# Patient Record
Sex: Female | Born: 1990 | Hispanic: No | Marital: Married | Smoking: Never smoker
Health system: Southern US, Community
[De-identification: ages and names within clinical notes are randomized; demographics above are authoritative.]

## PROBLEM LIST (undated history)

## (undated) ENCOUNTER — Inpatient Hospital Stay (HOSPITAL_COMMUNITY): Payer: Medicaid Other

## (undated) ENCOUNTER — Inpatient Hospital Stay (HOSPITAL_COMMUNITY): Payer: Self-pay

## (undated) DIAGNOSIS — R51 Headache: Secondary | ICD-10-CM

## (undated) DIAGNOSIS — I10 Essential (primary) hypertension: Secondary | ICD-10-CM

## (undated) DIAGNOSIS — G8929 Other chronic pain: Secondary | ICD-10-CM

## (undated) HISTORY — DX: Essential (primary) hypertension: I10

---
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
## 2009-09-23 ENCOUNTER — Observation Stay (HOSPITAL_COMMUNITY): Admission: EM | Admit: 2009-09-23 | Discharge: 2009-09-23 | Payer: Self-pay | Admitting: Emergency Medicine

## 2010-05-06 LAB — URINE MICROSCOPIC-ADD ON: RBC / HPF: NONE SEEN RBC/hpf (ref <3)

## 2010-05-06 LAB — CBC
MCV: 81.2 fL (ref 78.0–100.0)
Platelets: 235 10*3/uL (ref 150–400)
RDW: 15 % (ref 11.5–15.5)

## 2010-05-06 LAB — URINALYSIS, ROUTINE W REFLEX MICROSCOPIC
Hgb urine dipstick: NEGATIVE
Protein, ur: NEGATIVE mg/dL
Urobilinogen, UA: 0.2 mg/dL (ref 0.0–1.0)

## 2010-05-18 LAB — URINALYSIS, ROUTINE W REFLEX MICROSCOPIC
Bilirubin Urine: NEGATIVE
Glucose, UA: NEGATIVE mg/dL
Ketones, ur: NEGATIVE mg/dL
Leukocytes, UA: NEGATIVE
Nitrite: NEGATIVE
Protein, ur: NEGATIVE mg/dL
Specific Gravity, Urine: 1.01 (ref 1.005–1.030)
Urobilinogen, UA: 1 mg/dL (ref 0.0–1.0)
pH: 7.5 (ref 5.0–8.0)

## 2010-05-18 LAB — URINE MICROSCOPIC-ADD ON

## 2010-05-18 LAB — WET PREP, GENITAL
Clue Cells Wet Prep HPF POC: NONE SEEN
Trich, Wet Prep: NONE SEEN
WBC, Wet Prep HPF POC: NONE SEEN
Yeast Wet Prep HPF POC: NONE SEEN

## 2010-05-18 LAB — POCT PREGNANCY, URINE: Preg Test, Ur: NEGATIVE

## 2010-05-18 LAB — GC/CHLAMYDIA PROBE AMP, GENITAL
Chlamydia, DNA Probe: NEGATIVE
GC Probe Amp, Genital: NEGATIVE

## 2010-09-30 LAB — GC/CHLAMYDIA PROBE AMP, GENITAL: Gonorrhea: NEGATIVE

## 2010-09-30 LAB — HEPATITIS B SURFACE ANTIGEN: Hepatitis B Surface Ag: NEGATIVE

## 2010-09-30 LAB — HIV ANTIBODY (ROUTINE TESTING W REFLEX): HIV: NONREACTIVE

## 2010-09-30 LAB — CBC: HCT: 38 % (ref 36–46)

## 2010-09-30 LAB — VARICELLA ZOSTER ANTIBODY, IGG: Varicella: NON-IMMUNE/NOT IMMUNE

## 2010-09-30 LAB — RPR: RPR: NONREACTIVE

## 2010-09-30 LAB — ABO/RH: RH Type: POSITIVE

## 2010-10-02 LAB — GLUCOSE TOLERANCE, 3 HOURS
Glucose, 1 hour: 136
Glucose, Fasting: 75 mg/dL (ref 60–109)
Glucose: 85
Glucose: 93

## 2010-11-06 ENCOUNTER — Inpatient Hospital Stay (HOSPITAL_COMMUNITY)
Admission: AD | Admit: 2010-11-06 | Discharge: 2010-11-06 | Disposition: A | Discharge status: Home / Self Care | Payer: Medicaid Other | Source: Ambulatory Visit | Attending: Obstetrics & Gynecology | Admitting: Obstetrics & Gynecology

## 2010-11-06 ENCOUNTER — Encounter (HOSPITAL_COMMUNITY): Payer: Self-pay | Admitting: *Deleted

## 2010-11-06 DIAGNOSIS — G44209 Tension-type headache, unspecified, not intractable: Secondary | ICD-10-CM | POA: Insufficient documentation

## 2010-11-06 DIAGNOSIS — O10019 Pre-existing essential hypertension complicating pregnancy, unspecified trimester: Secondary | ICD-10-CM | POA: Insufficient documentation

## 2010-11-06 DIAGNOSIS — O99891 Other specified diseases and conditions complicating pregnancy: Secondary | ICD-10-CM | POA: Insufficient documentation

## 2010-11-06 DIAGNOSIS — R1012 Left upper quadrant pain: Secondary | ICD-10-CM | POA: Insufficient documentation

## 2010-11-06 DIAGNOSIS — O0992 Supervision of high risk pregnancy, unspecified, second trimester: Secondary | ICD-10-CM

## 2010-11-06 DIAGNOSIS — S39011A Strain of muscle, fascia and tendon of abdomen, initial encounter: Secondary | ICD-10-CM

## 2010-11-06 DIAGNOSIS — O0990 Supervision of high risk pregnancy, unspecified, unspecified trimester: Secondary | ICD-10-CM

## 2010-11-06 HISTORY — DX: Headache: R51

## 2010-11-06 HISTORY — DX: Other chronic pain: G89.29

## 2010-11-06 LAB — CBC
HCT: 34.9 % — ABNORMAL LOW (ref 36.0–46.0)
Hemoglobin: 11.9 g/dL — ABNORMAL LOW (ref 12.0–15.0)
MCH: 27.4 pg (ref 26.0–34.0)
MCHC: 34.1 g/dL (ref 30.0–36.0)
WBC: 7.8 10*3/uL (ref 4.0–10.5)

## 2010-11-06 LAB — COMPREHENSIVE METABOLIC PANEL
AST: 15 U/L (ref 0–37)
Alkaline Phosphatase: 77 U/L (ref 39–117)
Chloride: 101 mEq/L (ref 96–112)
Creatinine, Ser: <0.47 mg/dL — ABNORMAL LOW (ref 0.50–1.10)
Potassium: 3.5 mEq/L (ref 3.5–5.1)
Total Protein: 7.4 g/dL (ref 6.0–8.3)

## 2010-11-06 LAB — URINALYSIS, ROUTINE W REFLEX MICROSCOPIC
Glucose, UA: NEGATIVE mg/dL
Ketones, ur: 15 mg/dL — AB
Leukocytes, UA: NEGATIVE
Nitrite: NEGATIVE
Protein, ur: NEGATIVE mg/dL
Specific Gravity, Urine: >1.03 — ABNORMAL HIGH (ref 1.005–1.030)

## 2010-11-06 LAB — URINE MICROSCOPIC-ADD ON

## 2010-11-06 MED ORDER — ACETAMINOPHEN 160 MG/5ML PO SOLN
650.0000 mg | Freq: Four times a day (QID) | ORAL | Status: DC | PRN
  Administered 2010-11-06: 649.6 mg via ORAL
  Filled 2010-11-06: qty 20.3

## 2010-11-06 NOTE — Progress Notes (Signed)
Headache for past 3 wks. Some relief with tylenol.  Sent from health dept (hx BP elevation since Aug).  Also having pain  In left upper quad, daily since beginning of preg.

## 2010-11-06 NOTE — ED Provider Notes (Signed)
Tina A Munsor19 [id number] Chief Complaint  Patient presents with  . Headache    SUBJECTIVE  HPI: Pt was sent here by [facility] where she had a prenatal visit today. She complained of a headache that has been present intermittently for 2-3 weeks and pain in her left upper quadrant resident for about 2 months. BP was elevated to about 140/90. Of note blood pressure had been mildly elevated at a prior visit and she has been referred to high-risk clinic for chronic hypertension. Also this pregnancy she had a elevated one-hour Glucola of 169 followed by a normal three-hour OGTT. She denies any history of chronic hypertension prior to the pregnancy and had no hypertension with her previous pregnancies. The headache waxes and wanes over the past 2 or 3 weeks. She describes it as numbness all of her head. 2 days ago she tried Tylenol and got some relief. She wakes up with a headache. At present she describes as mild. She denies focal neurologic symptoms or visual disturbance.  Abdominal pain is located in the left upper quadrant and she has difficulty characterizing it the adenosine baseline Interpreter. She does report that the pain is worse when she lays on her right side but denies any other relation to position, food intake, bowel movements.   Past Medical History  Diagnosis Date  . Chronic headaches    Ob Hx: Gyn Hx: History reviewed. No pertinent past surgical history. History   Social History  . Marital Status: Married    Spouse Name: N/A    Number of Children: N/A  . Years of Education: N/A   Occupational History  . Not on file.   Social History Main Topics  . Smoking status: Never Smoker   . Smokeless tobacco: Never Used  . Alcohol Use: No  . Drug Use: No  . Sexually Active: Yes   Other Topics Concern  . Not on file   Social History Narrative  . No narrative on file   No current facility-administered medications on file prior to encounter.    No current outpatient prescriptions on file prior to encounter.   No Known Allergies Review of Systems  Constitutional: Negative for fever, chills, weight loss and malaise/fatigue.  HENT: Negative for ear pain, congestion and neck pain.   Eyes: Negative for blurred vision and double vision.  Respiratory: Negative for cough.   Cardiovascular: Negative for chest pain, palpitations and orthopnea.  Gastrointestinal: Positive for heartburn and abdominal pain. Negative for nausea, vomiting, diarrhea, constipation and blood in stool.  Genitourinary: Negative for dysuria, urgency, frequency, hematuria and flank pain.  Musculoskeletal: Negative for back pain.  Neurological: Positive for headaches. Negative for weakness.  '  OBJECTIVE  BP 128/72  Pulse 93  Temp(Src) 98.6 F (37 C) (Oral)  Resp 18  Ht 5' 3.25" (1.607 m)  Wt 95.709 kg (211 lb)  BMI 37.08 kg/m2   Physical Exam: Doptone fetal heart 145  General: WN/WD in NAD Abd: obese soft nondistended. There is moderate tenderness to palpation left upper quadrant, no guarding or rebound. Pelvic: digital exam: Patient isn't tolerant of the exam but cervix is thick and closed. Back: No CVA tenderness. Neuro: grossly intact, gait nl Results for orders placed during the hospital encounter of 11/06/10 (from the past 24 hour(s))  URINALYSIS, ROUTINE W REFLEX MICROSCOPIC     Status: Abnormal   Collection Time   11/06/10  1:45 PM      Component Value Range   Color, Urine YELLOW  YELLOW    Appearance CLEAR  CLEAR    Specific Gravity, Urine >1.030 (*) 1.005 - 1.030    pH 5.5  5.0 - 8.0    Glucose, UA NEGATIVE  NEGATIVE (mg/dL)   Hgb urine dipstick TRACE (*) NEGATIVE    Bilirubin Urine NEGATIVE  NEGATIVE    Ketones, ur 15 (*) NEGATIVE (mg/dL)   Protein, ur NEGATIVE  NEGATIVE (mg/dL)   Urobilinogen, UA 0.2  0.0 - 1.0 (mg/dL)   Nitrite NEGATIVE  NEGATIVE    Leukocytes, UA NEGATIVE  NEGATIVE   URINE MICROSCOPIC-ADD ON     Status: Abnormal    Collection Time   11/06/10  1:45 PM      Component Value Range   Squamous Epithelial / LPF MANY (*) RARE    WBC, UA 0-2  <3 (WBC/hpf)   Bacteria, UA FEW (*) RARE    Crystals URIC ACID CRYSTALS (*) NEGATIVE   CBC     Status: Abnormal   Collection Time   11/06/10  1:49 PM      Component Value Range   WBC 7.8  4.0 - 10.5 (K/uL)   RBC 4.34  3.87 - 5.11 (MIL/uL)   Hemoglobin 11.9 (*) 12.0 - 15.0 (g/dL)   HCT 96.0 (*) 45.4 - 46.0 (%)   MCV 80.4  78.0 - 100.0 (fL)   MCH 27.4  26.0 - 34.0 (pg)   MCHC 34.1  30.0 - 36.0 (g/dL)   RDW 09.8  11.9 - 14.7 (%)   Platelets 211  150 - 400 (K/uL)  COMPREHENSIVE METABOLIC PANEL     Status: Abnormal (Preliminary result)   Collection Time   11/06/10  1:49 PM      Component Value Range   Sodium 135  135 - 145 (mEq/L)   Potassium 3.5  3.5 - 5.1 (mEq/L)   Chloride 101  96 - 112 (mEq/L)   CO2 23  19 - 32 (mEq/L)   Glucose, Bld 70  70 - 99 (mg/dL)   BUN 6  6 - 23 (mg/dL)   Creatinine, Ser PENDING  0.50 - 1.10 (mg/dL)   Calcium 9.4  8.4 - 82.9 (mg/dL)   Total Protein 7.4  6.0 - 8.3 (g/dL)   Albumin 3.0 (*) 3.5 - 5.2 (g/dL)   AST 15  0 - 37 (U/L)   ALT 15  0 - 35 (U/L)   Alkaline Phosphatase 77  39 - 117 (U/L)   Total Bilirubin 0.2 (*) 0.3 - 1.2 (mg/dL)   GFR calc non Af Amer NOT CALCULATED  >90 (mL/min)   GFR calc Af Amer NOT CALCULATED  >90 (mL/min)   MAU course: Headache improved after Tylenol ASSESSMENT  G2 P1 01 at 15 weeks 6 days Mild chronic hypertension Tension headache Left upper quadrant pain most likely musculoskeletal origin  PLAN: Advised not to take aspirin during pregnancy. Abdominal sites name exercises and round ligament pain handout given. Tylenol dosages for headache are reviewed. Followup at [facility] health.    PLAN

## 2010-11-06 NOTE — ED Notes (Signed)
No adverse reaction to tylenol

## 2010-11-11 DIAGNOSIS — J45909 Unspecified asthma, uncomplicated: Secondary | ICD-10-CM | POA: Insufficient documentation

## 2010-11-11 DIAGNOSIS — B019 Varicella without complication: Secondary | ICD-10-CM | POA: Insufficient documentation

## 2010-11-11 DIAGNOSIS — E669 Obesity, unspecified: Secondary | ICD-10-CM

## 2010-11-11 DIAGNOSIS — Z603 Acculturation difficulty: Secondary | ICD-10-CM | POA: Insufficient documentation

## 2010-11-11 DIAGNOSIS — I10 Essential (primary) hypertension: Secondary | ICD-10-CM | POA: Insufficient documentation

## 2010-11-11 LAB — CBC
ALT: 15 U/L (ref 7–35)
Creatine, 24H Ur: 1662
Protein, 24H Urine: 86
platelet count: 234

## 2010-11-11 NOTE — Progress Notes (Signed)
Psychosocial and domestic violence screen done at health department 09/30/10

## 2010-11-12 ENCOUNTER — Telehealth: Payer: Self-pay | Admitting: *Deleted

## 2010-11-12 ENCOUNTER — Ambulatory Visit (INDEPENDENT_AMBULATORY_CARE_PROVIDER_SITE_OTHER): Payer: Medicaid Other | Admitting: Obstetrics & Gynecology

## 2010-11-12 VITALS — BP 127/83 | Temp 98.0°F | Wt 211.3 lb

## 2010-11-12 DIAGNOSIS — Z23 Encounter for immunization: Secondary | ICD-10-CM

## 2010-11-12 DIAGNOSIS — I10 Essential (primary) hypertension: Secondary | ICD-10-CM

## 2010-11-12 LAB — POCT URINALYSIS DIP (DEVICE)
Glucose, UA: NEGATIVE mg/dL
Leukocytes, UA: NEGATIVE
Nitrite: NEGATIVE
Protein, ur: NEGATIVE mg/dL
Urobilinogen, UA: 0.2 mg/dL (ref 0.0–1.0)

## 2010-11-12 MED ORDER — INFLUENZA VIRUS VACC SPLIT PF IM SUSP
0.5000 mL | Freq: Once | INTRAMUSCULAR | Status: AC
  Administered 2010-11-12: 0.5 mL via INTRAMUSCULAR

## 2010-11-12 NOTE — Progress Notes (Signed)
Nutrition Note:  Seen for 1st [facility] visit, pt receives [facility] services. Pt dx HTN and obesity.  Pg wt-213#, current 211#.  Weight loss of 2# @ [redacted]w[redacted]d gestation plots around 3#< expected. Intake is adequate, reports 3 meals and snacks daily, no N/V or food allergies.  Disc wt gain goals of 11-20#.  Follow up if referred Cy Blamer, RD

## 2010-11-12 NOTE — Telephone Encounter (Signed)
Pt husband called with questions about Sanyiah's medicaid- would like a call back

## 2010-11-12 NOTE — Progress Notes (Signed)
Pulse 109. No vaginal discharge. Pt to see nutrition and social worker today. Pt signed consent for flu vaccine. Used interpreter [employer].

## 2010-11-12 NOTE — Progress Notes (Signed)
Sent from [facility] due to borderline HTN and headace. HA now improved, BP not elevated. No H/O preeclampsia.  Schedule U/S in 2 weeks, RTC 3 weeks. Flu vaccine today. Low risk for now Past Medical History  Diagnosis Date  . Chronic headaches   . Asthma   . Hypertension    No Known Allergies No current outpatient prescriptions on file prior to visit.   Past Surgical History  Procedure Date  . Tonsillectomy 2007

## 2010-11-17 NOTE — Telephone Encounter (Signed)
I contacted pt with [employer] interpreter # [phone].  Pt stated that her husband was not @ home but the question was about her medicaid. She states that the medicaid was only good for 2 mos and will be expired by the time of her next appt.  I explained that she may apply for financial assistance through [facility] and if she qualifies, she may get some discount on her bill. Pt did not understand and stated that she could apply for medicaid in 1 year. She states that she completed her application on [employer].  I found it difficult to explain the medicaid situation and [facility] charity care over the phone. I recommended the following to the pt :  If she and her husband still have questions about medicaid, they should to return to the [place] office for clarification. When she returns for her next clinic appt on 10/31, we will explain the charity care program to she and her husband. I scheduled pt's anatomy US so that it will be covered by her presumptive medicaid as it had already been ordered. The appt is 11/20/10 @ 1030. Pt voiced understanding.

## 2010-11-20 ENCOUNTER — Other Ambulatory Visit: Payer: Self-pay | Admitting: Obstetrics & Gynecology

## 2010-11-20 ENCOUNTER — Ambulatory Visit (HOSPITAL_COMMUNITY)
Admission: RE | Admit: 2010-11-20 | Discharge: 2010-11-20 | Disposition: A | Discharge status: Home / Self Care | Payer: Medicaid Other | Source: Ambulatory Visit | Attending: Obstetrics & Gynecology | Admitting: Obstetrics & Gynecology

## 2010-11-20 DIAGNOSIS — E669 Obesity, unspecified: Secondary | ICD-10-CM | POA: Insufficient documentation

## 2010-11-20 DIAGNOSIS — I10 Essential (primary) hypertension: Secondary | ICD-10-CM

## 2010-11-20 DIAGNOSIS — O139 Gestational [pregnancy-induced] hypertension without significant proteinuria, unspecified trimester: Secondary | ICD-10-CM | POA: Insufficient documentation

## 2010-11-20 DIAGNOSIS — Z3689 Encounter for other specified antenatal screening: Secondary | ICD-10-CM | POA: Insufficient documentation

## 2010-12-03 ENCOUNTER — Ambulatory Visit (INDEPENDENT_AMBULATORY_CARE_PROVIDER_SITE_OTHER): Payer: Medicaid Other | Admitting: Family Medicine

## 2010-12-03 ENCOUNTER — Encounter: Payer: Self-pay | Admitting: Family Medicine

## 2010-12-03 ENCOUNTER — Other Ambulatory Visit: Payer: Self-pay | Admitting: Obstetrics & Gynecology

## 2010-12-03 VITALS — BP 142/92 | Temp 98.9°F | Wt 213.9 lb

## 2010-12-03 DIAGNOSIS — O10019 Pre-existing essential hypertension complicating pregnancy, unspecified trimester: Secondary | ICD-10-CM | POA: Insufficient documentation

## 2010-12-03 LAB — POCT URINALYSIS DIP (DEVICE)
Glucose, UA: NEGATIVE mg/dL
Nitrite: NEGATIVE
Protein, ur: NEGATIVE mg/dL
Specific Gravity, Urine: 1.025 (ref 1.005–1.030)
Urobilinogen, UA: 0.2 mg/dL (ref 0.0–1.0)

## 2010-12-03 NOTE — Progress Notes (Signed)
If BP remains elevated consider agent for lowering BP---Pt. Has asthma, may consider CCB. Had h/a last pm, resolved now.

## 2010-12-03 NOTE — Progress Notes (Signed)
Pulse-108.  BP Recheck- 140/98

## 2010-12-03 NOTE — Patient Instructions (Signed)
Pregnancy - Second Trimester The second trimester of pregnancy (3 to 6 months) is a period of rapid growth for you and your baby. At the end of the sixth month, your baby is about 9 inches long and weighs 1 1/2 pounds. You will begin to feel the baby move between 18 and 20 weeks of the pregnancy. This is called quickening. Weight gain is faster. A clear fluid (colostrum) may leak out of your breasts. You may feel small contractions of the womb (uterus). This is known as false labor or Braxton-Hicks contractions. This is like a practice for labor when the baby is ready to be born. Usually, the problems with morning sickness have usually passed by the end of your first trimester. Some women develop small dark blotches (called cholasma, mask of pregnancy) on their face that usually goes away after the baby is born. Exposure to the sun makes the blotches worse. Acne may also develop in some pregnant women and pregnant women who have acne, may find that it goes away. PRENATAL EXAMS  Blood work may continue to be done during prenatal exams. These tests are done to check on your health and the probable health of your baby. Blood work is used to follow your blood levels (hemoglobin). Anemia (low hemoglobin) is common during pregnancy. Iron and vitamins are given to help prevent this. You will also be checked for diabetes between 24 and 28 weeks of the pregnancy. Some of the previous blood tests may be repeated.   The size of the uterus is measured during each visit. This is to make sure that the baby is continuing to grow properly according to the dates of the pregnancy.   Your blood pressure is checked every prenatal visit. This is to make sure you are not getting toxemia.   Your urine is checked to make sure you do not have an infection, diabetes or protein in the urine.   Your weight is checked often to make sure gains are happening at the suggested rate. This is to ensure that both you and your baby are  growing normally.   Sometimes, an ultrasound is performed to confirm the proper growth and development of the baby. This is a test which bounces harmless sound waves off the baby so your caregiver can more accurately determine due dates.  Sometimes, a specialized test is done on the amniotic fluid surrounding the baby. This test is called an amniocentesis. The amniotic fluid is obtained by sticking a needle into the belly (abdomen). This is done to check the chromosomes in instances where there is a concern about possible genetic problems with the baby. It is also sometimes done near the end of pregnancy if an early delivery is required. In this case, it is done to help make sure the baby's lungs are mature enough for the baby to live outside of the womb. CHANGES OCCURING IN THE SECOND TRIMESTER OF PREGNANCY Your body goes through many changes during pregnancy. They vary from person to person. Talk to your caregiver about changes you notice that you are concerned about.  During the second trimester, you will likely have an increase in your appetite. It is normal to have cravings for certain foods. This varies from person to person and pregnancy to pregnancy.   Your lower abdomen will begin to bulge.   You may have to urinate more often because the uterus and baby are pressing on your bladder. It is also common to get more bladder infections during pregnancy (  pain with urination). You can help this by drinking lots of fluids and emptying your bladder before and after intercourse.   You may begin to get stretch marks on your hips, abdomen, and breasts. These are normal changes in the body during pregnancy. There are no exercises or medications to take that prevent this change.   You may begin to develop swollen and bulging veins (varicose veins) in your legs. Wearing support hose, elevating your feet for 15 minutes, 3 to 4 times a day and limiting salt in your diet helps lessen the problem.    Heartburn may develop as the uterus grows and pushes up against the stomach. Antacids recommended by your caregiver helps with this problem. Also, eating smaller meals 4 to 5 times a day helps.   Constipation can be treated with a stool softener or adding bulk to your diet. Drinking lots of fluids, vegetables, fruits, and whole grains are helpful.   Exercising is also helpful. If you have been very active up until your pregnancy, most of these activities can be continued during your pregnancy. If you have been less active, it is helpful to start an exercise program such as walking.   Hemorrhoids (varicose veins in the rectum) may develop at the end of the second trimester. Warm sitz baths and hemorrhoid cream recommended by your caregiver helps hemorrhoid problems.   Backaches may develop during this time of your pregnancy. Avoid heavy lifting, wear low heal shoes and practice good posture to help with backache problems.   Some pregnant women develop tingling and numbness of their hand and fingers because of swelling and tightening of ligaments in the wrist (carpel tunnel syndrome). This goes away after the baby is born.   As your breasts enlarge, you may have to get a bigger bra. Get a comfortable, cotton, support bra. Do not get a nursing bra until the last month of the pregnancy if you will be nursing the baby.   You may get a dark line from your belly button to the pubic area called the linea nigra.   You may develop rosy cheeks because of increase blood flow to the face.   You may develop spider looking lines of the face, neck, arms and chest. These go away after the baby is born.  HOME CARE INSTRUCTIONS   It is extremely important to avoid all smoking, herbs, alcohol, and unprescribed drugs during your pregnancy. These chemicals affect the formation and growth of the baby. Avoid these chemicals throughout the pregnancy to ensure the delivery of a healthy infant.   Most of your home  care instructions are the same as suggested for the first trimester of your pregnancy. Keep your caregiver's appointments. Follow your caregiver's instructions regarding medication use, exercise and diet.   During pregnancy, you are providing food for you and your baby. Continue to eat regular, well-balanced meals. Choose foods such as meat, fish, milk and other low fat dairy products, vegetables, fruits, and whole-grain breads and cereals. Your caregiver will tell you of the ideal weight gain.   A physical sexual relationship may be continued up until near the end of pregnancy if there are no other problems. Problems could include early (premature) leaking of amniotic fluid from the membranes, vaginal bleeding, abdominal pain, or other medical or pregnancy problems.   Exercise regularly if there are no restrictions. Check with your caregiver if you are unsure of the safety of some of your exercises. The greatest weight gain will occur in the   last 2 trimesters of pregnancy. Exercise will help you:   Control your weight.   Get you in shape for labor and delivery.   Lose weight after you have the baby.   Wear a good support or jogging bra for breast tenderness during pregnancy. This may help if worn during sleep. Pads or tissues may be used in the bra if you are leaking colostrum.   Do not use hot tubs, steam rooms or saunas throughout the pregnancy.   Wear your seat belt at all times when driving. This protects you and your baby if you are in an accident.   Avoid raw meat, uncooked cheese, cat litter boxes and soil used by cats. These carry germs that can cause birth defects in the baby.   The second trimester is also a good time to visit your dentist for your dental health if this has not been done yet. Getting your teeth cleaned is OK. Use a soft toothbrush. Brush gently during pregnancy.   It is easier to loose urine during pregnancy. Tightening up and strengthening the pelvic muscles will  help with this problem. Practice stopping your urination while you are going to the bathroom. These are the same muscles you need to strengthen. It is also the muscles you would use as if you were trying to stop from passing gas. You can practice tightening these muscles up 10 times a set and repeating this about 3 times per day. Once you know what muscles to tighten up, do not perform these exercises during urination. It is more likely to contribute to an infection by backing up the urine.   Ask for help if you have financial, counseling or nutritional needs during pregnancy. Your caregiver will be able to offer counseling for these needs as well as refer you for other special needs.   Your skin may become oily. If so, wash your face with mild soap, use non-greasy moisturizer and oil or cream based makeup.  MEDICATIONS AND DRUG USE IN PREGNANCY  Take prenatal vitamins as directed. The vitamin should contain 1 milligram of folic acid. Keep all vitamins out of reach of children. Only a couple vitamins or tablets containing iron may be fatal to a baby or young child when ingested.   Avoid use of all medications, including herbs, over-the-counter medications, not prescribed or suggested by your caregiver. Only take over-the-counter or prescription medicines for pain, discomfort, or fever as directed by your caregiver. Do not use aspirin.   Let your caregiver also know about herbs you may be using.   Alcohol is related to a number of birth defects. This includes fetal alcohol syndrome. All alcohol, in any form, should be avoided completely. Smoking will cause low birth rate and premature babies.   Street or illegal drugs are very harmful to the baby. They are absolutely forbidden. A baby born to an addicted mother will be addicted at birth. The baby will go through the same withdrawal an adult does.  SEEK MEDICAL CARE IF:  You have any concerns or worries during your pregnancy. It is better to call with  your questions if you feel they cannot wait, rather than worry about them. SEEK IMMEDIATE MEDICAL CARE IF:   An unexplained oral temperature above 102 F (38.9 C) develops, or as your caregiver suggests.   You have leaking of fluid from the vagina (birth canal). If leaking membranes are suspected, take your temperature and tell your caregiver of this when you call.   There   is vaginal spotting, bleeding, or passing clots. Tell your caregiver of the amount and how many pads are used. Light spotting in pregnancy is common, especially following intercourse.   You develop a bad smelling vaginal discharge with a change in the color from clear to white.   You continue to feel sick to your stomach (nauseated) and have no relief from remedies suggested. You vomit blood or coffee ground-like materials.   You lose more than 2 pounds of weight or gain more than 2 pounds of weight over 1 week, or as suggested by your caregiver.   You notice swelling of your face, hands, feet, or legs.   You get exposed to German measles and have never had them.   You are exposed to fifth disease or chickenpox.   You develop belly (abdominal) pain. Round ligament discomfort is a common non-cancerous (benign) cause of abdominal pain in pregnancy. Your caregiver still must evaluate you.   You develop a bad headache that does not go away.   You develop fever, diarrhea, pain with urination, or shortness of breath.   You develop visual problems, blurry, or double vision.   You fall or are in a car accident or any kind of trauma.   There is mental or physical violence at home.  Document Released: 01/12/2001 Document Revised: 09/30/2010 Document Reviewed: 07/17/2008 [employer] Patient Information 2012 [employer]. Breastfeeding BENEFITS OF BREASTFEEDING For the baby  The first milk (colostrum) helps the baby's digestive system function better.   There are antibodies from the mother in the milk that help the  baby fight off infections.   The baby has a lower incidence of asthma, allergies, and SIDS (sudden infant death syndrome).   The nutrients in breast milk are better than formulas for the baby and helps the baby's brain grow better.   Babies who breastfeed have less gas, colic, and constipation.  For the mother  Breastfeeding helps develop a very special bond between mother and baby.   It is more convenient, always available at the correct temperature and cheaper than formula feeding.   It burns calories in the mother and helps with losing weight that was gained during pregnancy.   It makes the uterus contract back down to normal size faster and slows bleeding following delivery.   Breastfeeding mothers have a lower risk of developing breast cancer.  NURSE FREQUENTLY  A healthy, full-term baby may breastfeed as often as every hour or space his or her feedings to every 3 hours.   How often to nurse will vary from baby to baby. Watch your baby for signs of hunger, not the clock.   Nurse as often as the baby requests, or when you feel the need to reduce the fullness of your breasts.   Awaken the baby if it has been 3 to 4 hours since the last feeding.   Frequent feeding will help the mother make more milk and will prevent problems like sore nipples and engorgement of the breasts.  BABY'S POSITION AT THE BREAST  Whether lying down or sitting, be sure that the baby's tummy is facing your tummy.   Support the breast with 4 fingers underneath the breast and the thumb above. Make sure your fingers are well away from the nipple and baby's mouth.   Stroke the baby's lips and cheek closest to the breast gently with your finger or nipple.   When the baby's mouth is open wide enough, place all of your nipple and as much   of the dark area around the nipple as possible into your baby's mouth.   Pull the baby in close so the tip of the nose and the baby's cheeks touch the breast during the  feeding.  FEEDINGS  The length of each feeding varies from baby to baby and from feeding to feeding.   The baby must suck about 2 to 3 minutes for your milk to get to him or her. This is called a "let down." For this reason, allow the baby to feed on each breast as long as he or she wants. Your baby will end the feeding when he or she has received the right balance of nutrients.   To break the suction, put your finger into the corner of the baby's mouth and slide it between his or her gums before removing your breast from his or her mouth. This will help prevent sore nipples.  REDUCING BREAST ENGORGEMENT  In the first week after your baby is born, you may experience signs of breast engorgement. When breasts are engorged, they feel heavy, warm, full, and may be tender to the touch. You can reduce engorgement if you:   Nurse frequently, every 2 to 3 hours. Mothers who breastfeed early and often have fewer problems with engorgement.   Place light ice packs on your breasts between feedings. This reduces swelling. Wrap the ice packs in a lightweight towel to protect your skin.   Apply moist hot packs to your breast for 5 to 10 minutes before each feeding. This increases circulation and helps the milk flow.   Gently massage your breast before and during the feeding.   Make sure that the baby empties at least one breast at every feeding before switching sides.   Use a breast pump to empty the breasts if your baby is sleepy or not nursing well. You may also want to pump if you are returning to work or or you feel you are getting engorged.   Avoid bottle feeds, pacifiers or supplemental feedings of water or juice in place of breastfeeding.   Be sure the baby is latched on and positioned properly while breastfeeding.   Prevent fatigue, stress, and anemia.   Wear a supportive bra, avoiding underwire styles.   Eat a balanced diet with enough fluids.  If you follow these suggestions, your  engorgement should improve in 24 to 48 hours. If you are still experiencing difficulty, call your lactation consultant or caregiver. IS MY BABY GETTING ENOUGH MILK? Sometimes, mothers worry about whether their babies are getting enough milk. You can be assured that your baby is getting enough milk if:  The baby is actively sucking and you hear swallowing.   The baby nurses at least 8 to 12 times in a 24 hour time period. Nurse your baby until he or she unlatches or falls asleep at the first breast (at least 10 to 20 minutes), then offer the second side.   The baby is wetting 5 to 6 disposable diapers (6 to 8 cloth diapers) in a 24 hour period by 5 to 6 days of age.   The baby is having at least 2 to 3 stools every 24 hours for the first few months. Breast milk is all the food your baby needs. It is not necessary for your baby to have water or formula. In fact, to help your breasts make more milk, it is best not to give your baby supplemental feedings during the early weeks.   The stool should   be soft and yellow.   The baby should gain 4 to 7 ounces per week after he is 4 days old.  TAKE CARE OF YOURSELF Take care of your breasts by:  Bathing or showering daily.   Avoiding the use of soaps on your nipples.   Start feedings on your left breast at one feeding and on your right breast at the next feeding.   You will notice an increase in your milk supply 2 to 5 days after delivery. You may feel some discomfort from engorgement, which makes your breasts very firm and often tender. Engorgement "peaks" out within 24 to 48 hours. In the meantime, apply warm moist towels to your breasts for 5 to 10 minutes before feeding. Gentle massage and expression of some milk before feeding will soften your breasts, making it easier for your baby to latch on. Wear a well fitting nursing bra and air dry your nipples for 10 to 15 minutes after each feeding.   Only use cotton bra pads.   Only use pure lanolin on  your nipples after nursing. You do not need to wash it off before nursing.  Take care of yourself by:   Eating well-balanced meals and nutritious snacks.   Drinking milk, fruit juice, and water to satisfy your thirst (about 8 glasses a day).   Getting plenty of rest.   Increasing calcium in your diet (1200 mg a day).   Avoiding foods that you notice affect the baby in a bad way.  SEEK MEDICAL CARE IF:   You have any questions or difficulty with breastfeeding.   You need help.   You have a hard, red, sore area on your breast, accompanied by a fever of 100.5 F (38.1 C) or more.   Your baby is too sleepy to eat well or is having trouble sleeping.   Your baby is wetting less than 6 diapers per day, by 5 days of age.   Your baby's skin or white part of his or her eyes is more yellow than it was in the hospital.   You feel depressed.  Document Released: 01/18/2005 Document Revised: 09/30/2010 Document Reviewed: 09/02/2008 [employer] Patient Information 2012 [employer]. 

## 2010-12-07 ENCOUNTER — Inpatient Hospital Stay (HOSPITAL_COMMUNITY)
Admission: AD | Admit: 2010-12-07 | Discharge: 2010-12-07 | Disposition: A | Discharge status: Home / Self Care | Payer: Medicaid Other | Source: Ambulatory Visit | Attending: Obstetrics and Gynecology | Admitting: Obstetrics and Gynecology

## 2010-12-07 DIAGNOSIS — G44209 Tension-type headache, unspecified, not intractable: Secondary | ICD-10-CM

## 2010-12-07 DIAGNOSIS — O99891 Other specified diseases and conditions complicating pregnancy: Secondary | ICD-10-CM | POA: Insufficient documentation

## 2010-12-07 DIAGNOSIS — O10019 Pre-existing essential hypertension complicating pregnancy, unspecified trimester: Secondary | ICD-10-CM

## 2010-12-07 LAB — URINALYSIS, ROUTINE W REFLEX MICROSCOPIC
Glucose, UA: NEGATIVE mg/dL
Ketones, ur: 15 mg/dL — AB
Protein, ur: NEGATIVE mg/dL
Urobilinogen, UA: 0.2 mg/dL (ref 0.0–1.0)

## 2010-12-07 MED ORDER — CYCLOBENZAPRINE HCL 10 MG PO TABS: 10.0000 mg | ORAL_TABLET | Freq: Two times a day (BID) | ORAL | Status: AC | PRN

## 2010-12-07 MED ORDER — ACETAMINOPHEN 325 MG PO TABS
650.0000 mg | ORAL_TABLET | Freq: Once | ORAL | Status: AC
  Administered 2010-12-07: 650 mg via ORAL
  Filled 2010-12-07: qty 2

## 2010-12-07 MED ORDER — CYCLOBENZAPRINE HCL 10 MG PO TABS
10.0000 mg | ORAL_TABLET | Freq: Once | ORAL | Status: AC
  Administered 2010-12-07: 10 mg via ORAL
  Filled 2010-12-07: qty 1

## 2010-12-07 NOTE — Progress Notes (Signed)
On going headache.  " feeling like a heart beat in abd".  [facility] them last [facility].   Was to follow up with clinic in 2 wks.  No problem with  Last preg.

## 2010-12-07 NOTE — Progress Notes (Signed)
Has headache0 ongoing problem, wants blood pressure checked

## 2010-12-07 NOTE — ED Provider Notes (Signed)
History     Chief Complaint  Patient presents with  . Headache   Headache  Pertinent negatives include no abdominal pain, blurred vision, dizziness, hearing loss, nausea, photophobia or vomiting.   This is a 20 year old G2 P1001 at 20 weeks and 1 day who presents with a headache for 2 weeks that has worsened over the past 3 days.  The headache is intermittent and is non-radiating.  She describes it as sharp at times, as well as pressure.  She denies vision changes, abdominal pain, nausea, fever, chills, myalgias.  She has not been treated for her hypertension yet, which she was diagnosed at her last doctor's appointment.  She denies photo and phonophobia.  OB History    Grav Para Term Preterm Abortions TAB SAB Ect Mult Living   2 1 1  0 0 0 0 0 0 1      Past Medical History  Diagnosis Date  . Chronic headaches   . Asthma   . Hypertension     Past Surgical History  Procedure Date  . Tonsillectomy 2007    Family History  Problem Relation Age of Onset  . Heart disease Mother   . Hypertension Mother   . Diabetes Mother   . Heart disease Father   . Diabetes Brother     History  Substance Use Topics  . Smoking status: Never Smoker   . Smokeless tobacco: Never Used  . Alcohol Use: No    Allergies: No Known Allergies  Prescriptions prior to admission  Medication Sig Dispense Refill  . acetaminophen (TYLENOL) 500 MG tablet Take 500 mg by mouth every 6 (six) hours as needed.        Marland Kitchen albuterol (PROVENTIL HFA;VENTOLIN HFA) 108 (90 BASE) MCG/ACT inhaler Inhale 2 puffs into the lungs every 6 (six) hours as needed.        . Prenatal Vit-Fe Psac Cmplx-FA (PRENATAL MULTIVITAMIN) 60-1 MG tablet Take 1 tablet by mouth daily.          Review of Systems  HENT: Negative for hearing loss.   Eyes: Negative for blurred vision, double vision and photophobia.  Gastrointestinal: Negative for nausea, vomiting and abdominal pain.  Genitourinary: Negative for dysuria, urgency, frequency  and hematuria.  Musculoskeletal: Negative for myalgias.  Neurological: Positive for headaches. Negative for dizziness, tremors and focal weakness.   Physical Exam   Blood pressure 128/73, pulse 103, temperature 99.1 F (37.3 C), temperature source Oral, resp. rate 20, height 5' 4.5" (1.638 m), weight 97.977 kg (216 lb), last menstrual period 07/19/2010, SpO2 98.00%.  Physical Exam  Constitutional: She is oriented to person, place, and time. She appears well-developed and well-nourished.  HENT:  Head: Normocephalic and atraumatic.       Increased cervical paraspinal tenderness  Eyes: Pupils are equal, round, and reactive to light.  Neck: Normal range of motion.  Cardiovascular: Normal rate, regular rhythm and normal heart sounds.   Respiratory: Effort normal and breath sounds normal. No respiratory distress. She has no wheezes. She has no rales.  GI: She exhibits no distension and no mass. There is no tenderness. There is no rebound and no guarding.  Neurological: She is alert and oriented to person, place, and time. She has normal reflexes. No cranial nerve deficit.  Skin: Skin is warm and dry.   UA - negative leukocytes with specific gravity of > 1.030.  Neg protein  MAU Course  Procedures  MDM As the patient is currently normotensive here and without proteinuria,  unlikely that this represents preeclampsia.  This is likely a tension headache, based on musculoskeletal tenderness in the cervical spine, combined with dehydration.  Assessment and Plan  1.  19 yo G2P1001 at 20 weeks and 1 day. 2.  Tension headache.  Will prescribe flexeril.  Described to patient that this can be sedating and that she should not drive.  Also discussed that she should drink 2-3 L of water to avoid dehydration.  Patient to follow up at [facility] clinics.    Barret Esquivel JEHIEL 12/07/2010, 7:17 PM

## 2010-12-17 ENCOUNTER — Ambulatory Visit (INDEPENDENT_AMBULATORY_CARE_PROVIDER_SITE_OTHER): Payer: Medicaid Other | Admitting: Obstetrics & Gynecology

## 2010-12-17 DIAGNOSIS — O0990 Supervision of high risk pregnancy, unspecified, unspecified trimester: Secondary | ICD-10-CM

## 2010-12-17 DIAGNOSIS — O10019 Pre-existing essential hypertension complicating pregnancy, unspecified trimester: Secondary | ICD-10-CM

## 2010-12-17 DIAGNOSIS — Z23 Encounter for immunization: Secondary | ICD-10-CM

## 2010-12-17 DIAGNOSIS — O169 Unspecified maternal hypertension, unspecified trimester: Secondary | ICD-10-CM

## 2010-12-17 LAB — POCT URINALYSIS DIP (DEVICE)
Bilirubin Urine: NEGATIVE
Glucose, UA: NEGATIVE mg/dL
Hgb urine dipstick: NEGATIVE
Nitrite: NEGATIVE

## 2010-12-17 MED ORDER — TETANUS-DIPHTH-ACELL PERTUSSIS 5-2.5-18.5 LF-MCG/0.5 IM SUSP
0.5000 mL | Freq: Once | INTRAMUSCULAR | Status: AC
  Administered 2010-12-17: 0.5 mL via INTRAMUSCULAR

## 2010-12-17 NOTE — Patient Instructions (Signed)
Breastfeeding BENEFITS OF BREASTFEEDING For the baby  The first milk (colostrum) helps the baby's digestive system function better.   There are antibodies from the mother in the milk that help the baby fight off infections.   The baby has a lower incidence of asthma, allergies, and SIDS (sudden infant death syndrome).   The nutrients in breast milk are better than formulas for the baby and helps the baby's brain grow better.   Babies who breastfeed have less gas, colic, and constipation.  For the mother  Breastfeeding helps develop a very special bond between mother and baby.   It is more convenient, always available at the correct temperature and cheaper than formula feeding.   It burns calories in the mother and helps with losing weight that was gained during pregnancy.   It makes the uterus contract back down to normal size faster and slows bleeding following delivery.   Breastfeeding mothers have a lower risk of developing breast cancer.  NURSE FREQUENTLY  A healthy, full-term baby may breastfeed as often as every hour or space his or her feedings to every 3 hours.   How often to nurse will vary from baby to baby. Watch your baby for signs of hunger, not the clock.   Nurse as often as the baby requests, or when you feel the need to reduce the fullness of your breasts.   Awaken the baby if it has been 3 to 4 hours since the last feeding.   Frequent feeding will help the mother make more milk and will prevent problems like sore nipples and engorgement of the breasts.  BABY'S POSITION AT THE BREAST  Whether lying down or sitting, be sure that the baby's tummy is facing your tummy.   Support the breast with 4 fingers underneath the breast and the thumb above. Make sure your fingers are well away from the nipple and baby's mouth.   Stroke the baby's lips and cheek closest to the breast gently with your finger or nipple.   When the baby's mouth is open wide enough, place  all of your nipple and as much of the dark area around the nipple as possible into your baby's mouth.   Pull the baby in close so the tip of the nose and the baby's cheeks touch the breast during the feeding.  FEEDINGS  The length of each feeding varies from baby to baby and from feeding to feeding.   The baby must suck about 2 to 3 minutes for your milk to get to him or her. This is called a "let down." For this reason, allow the baby to feed on each breast as long as he or she wants. Your baby will end the feeding when he or she has received the right balance of nutrients.   To break the suction, put your finger into the corner of the baby's mouth and slide it between his or her gums before removing your breast from his or her mouth. This will help prevent sore nipples.  REDUCING BREAST ENGORGEMENT  In the first week after your baby is born, you may experience signs of breast engorgement. When breasts are engorged, they feel heavy, warm, full, and may be tender to the touch. You can reduce engorgement if you:   Nurse frequently, every 2 to 3 hours. Mothers who breastfeed early and often have fewer problems with engorgement.   Place light ice packs on your breasts between feedings. This reduces swelling. Wrap the ice packs in a   lightweight towel to protect your skin.   Apply moist hot packs to your breast for 5 to 10 minutes before each feeding. This increases circulation and helps the milk flow.   Gently massage your breast before and during the feeding.   Make sure that the baby empties at least one breast at every feeding before switching sides.   Use a breast pump to empty the breasts if your baby is sleepy or not nursing well. You may also want to pump if you are returning to work or or you feel you are getting engorged.   Avoid bottle feeds, pacifiers or supplemental feedings of water or juice in place of breastfeeding.   Be sure the baby is latched on and positioned properly while  breastfeeding.   Prevent fatigue, stress, and anemia.   Wear a supportive bra, avoiding underwire styles.   Eat a balanced diet with enough fluids.  If you follow these suggestions, your engorgement should improve in 24 to 48 hours. If you are still experiencing difficulty, call your lactation consultant or caregiver. IS MY BABY GETTING ENOUGH MILK? Sometimes, mothers worry about whether their babies are getting enough milk. You can be assured that your baby is getting enough milk if:  The baby is actively sucking and you hear swallowing.   The baby nurses at least 8 to 12 times in a 24 hour time period. Nurse your baby until he or she unlatches or falls asleep at the first breast (at least 10 to 20 minutes), then offer the second side.   The baby is wetting 5 to 6 disposable diapers (6 to 8 cloth diapers) in a 24 hour period by 5 to 6 days of age.   The baby is having at least 2 to 3 stools every 24 hours for the first few months. Breast milk is all the food your baby needs. It is not necessary for your baby to have water or formula. In fact, to help your breasts make more milk, it is best not to give your baby supplemental feedings during the early weeks.   The stool should be soft and yellow.   The baby should gain 4 to 7 ounces per week after he is 4 days old.  TAKE CARE OF YOURSELF Take care of your breasts by:  Bathing or showering daily.   Avoiding the use of soaps on your nipples.   Start feedings on your left breast at one feeding and on your right breast at the next feeding.   You will notice an increase in your milk supply 2 to 5 days after delivery. You may feel some discomfort from engorgement, which makes your breasts very firm and often tender. Engorgement "peaks" out within 24 to 48 hours. In the meantime, apply warm moist towels to your breasts for 5 to 10 minutes before feeding. Gentle massage and expression of some milk before feeding will soften your breasts, making  it easier for your baby to latch on. Wear a well fitting nursing bra and air dry your nipples for 10 to 15 minutes after each feeding.   Only use cotton bra pads.   Only use pure lanolin on your nipples after nursing. You do not need to wash it off before nursing.  Take care of yourself by:   Eating well-balanced meals and nutritious snacks.   Drinking milk, fruit juice, and water to satisfy your thirst (about 8 glasses a day).   Getting plenty of rest.   Increasing calcium in   your diet (1200 mg a day).   Avoiding foods that you notice affect the baby in a bad way.  SEEK MEDICAL CARE IF:   You have any questions or difficulty with breastfeeding.   You need help.   You have a hard, red, sore area on your breast, accompanied by a fever of 100.5 F (38.1 C) or more.   Your baby is too sleepy to eat well or is having trouble sleeping.   Your baby is wetting less than 6 diapers per day, by 5 days of age.   Your baby's skin or white part of his or her eyes is more yellow than it was in the hospital.   You feel depressed.  Document Released: 01/18/2005 Document Revised: 09/30/2010 Document Reviewed: 09/02/2008 [employer] Patient Information 2012 [employer]. 

## 2010-12-17 NOTE — Progress Notes (Signed)
Used Interpreter: Bedor Elnegomi Good FM, no problems, no HA

## 2010-12-17 NOTE — Progress Notes (Signed)
Addended by: Faythe Casa on: 12/17/2010 02:06 PM   Modules accepted: Orders

## 2010-12-31 ENCOUNTER — Ambulatory Visit (INDEPENDENT_AMBULATORY_CARE_PROVIDER_SITE_OTHER): Payer: Medicaid Other | Admitting: Physician Assistant

## 2010-12-31 VITALS — BP 132/94 | Temp 98.0°F | Wt 213.6 lb

## 2010-12-31 DIAGNOSIS — O169 Unspecified maternal hypertension, unspecified trimester: Secondary | ICD-10-CM

## 2010-12-31 DIAGNOSIS — O0990 Supervision of high risk pregnancy, unspecified, unspecified trimester: Secondary | ICD-10-CM

## 2010-12-31 DIAGNOSIS — O10019 Pre-existing essential hypertension complicating pregnancy, unspecified trimester: Secondary | ICD-10-CM

## 2010-12-31 LAB — CBC
HCT: 36.1 % (ref 36.0–46.0)
Hemoglobin: 12.5 g/dL (ref 12.0–15.0)
RBC: 4.53 MIL/uL (ref 3.87–5.11)
WBC: 6.4 10*3/uL (ref 4.0–10.5)

## 2010-12-31 LAB — COMPREHENSIVE METABOLIC PANEL
Albumin: 3.5 g/dL (ref 3.5–5.2)
BUN: 5 mg/dL — ABNORMAL LOW (ref 6–23)
CO2: 20 mEq/L (ref 19–32)
Calcium: 9.2 mg/dL (ref 8.4–10.5)
Chloride: 103 mEq/L (ref 96–112)
Glucose, Bld: 68 mg/dL — ABNORMAL LOW (ref 70–99)
Potassium: 4.1 mEq/L (ref 3.5–5.3)

## 2010-12-31 LAB — POCT URINALYSIS DIP (DEVICE)
Bilirubin Urine: NEGATIVE
Leukocytes, UA: NEGATIVE
pH: 7 (ref 5.0–8.0)

## 2010-12-31 MED ORDER — NATALCARE PIC 60-1 MG PO TABS: 1.0000 | ORAL_TABLET | Freq: Every day | ORAL | Status: DC

## 2010-12-31 NOTE — Progress Notes (Signed)
Reports HA with visual disturbance 3 days ago. Denies today. + FM daily. Repeat labs and pro/creat ratio today. Growth [place] scheduled.

## 2010-12-31 NOTE — Patient Instructions (Addendum)
Preeclampsia and Eclampsia Preeclampsia is a condition of high blood pressure during pregnancy. It can happen at 20 weeks or later in pregnancy. If high blood pressure occurs in the second half of pregnancy with no other symptoms, it is called gestational hypertension and goes away after the baby is born. If any of the symptoms listed below develop with gestational hypertension, it is then called preeclampsia. Eclampsia (convulsions) may follow preeclampsia. This is one of the reasons for regular prenatal checkups. Early diagnosis and treatment are very important to prevent eclampsia. CAUSES  There is no known cause of preeclampsia/eclampsia in pregnancy. There are several known conditions that may put the pregnant woman at risk, such as:  The first pregnancy.   Having preeclampsia in a past pregnancy.   Having lasting (chronic) high blood pressure.   Having multiples (twins, triplets).   Being age 47 or older.   African American ethnic background.   Having kidney disease or diabetes.   Medical conditions such as lupus or blood diseases.   Being overweight (obese).  SYMPTOMS   High blood pressure.   Headaches.   Sudden weight gain.   Swelling of hands, face, legs, and feet.   Protein in the urine.   Feeling sick to your stomach (nauseous) and throwing up (vomiting).   Vision problems (blurred or double vision).   Numbness in the face, arms, legs, and feet.   Dizziness.   Slurred speech.   Preeclampsia can cause growth retardation in the fetus.   Separation (abruption) of the placenta.   Not enough fluid in the amniotic sac (oligohydramnios).   Sensitivity to bright lights.   Belly (abdominal) pain.  DIAGNOSIS  If protein is found in the urine in the second half of pregnancy, this is considered preeclampsia. Other symptoms mentioned above may also be present. TREATMENT  It is necessary to treat this.  Your caregiver may prescribe bed rest early in this  condition. Plenty of rest and salt restriction may be all that is needed.   Medicines may be necessary to lower blood pressure if the condition does not respond to more conservative measures.   In more severe cases, hospitalization may be needed:   For treatment of blood pressure.   To control fluid retention.   To monitor the baby to see if the condition is causing harm to the baby.   Hospitalization is the best way to treat the first sign of preeclampsia. This is so the mother and baby can be watched closely and blood tests can be done effectively and correctly.   If the condition becomes severe, it may be necessary to induce labor or to remove the infant by surgical means (cesarean section). The best cure for preeclampsia/eclampsia is to deliver the baby.  Preeclampsia and eclampsia involve risks to mother and infant. Your caregiver will discuss these risks with you. Together, you can work out the best possible approach to your problems. Make sure you keep your prenatal visits as scheduled. Not keeping appointments could result in a chronic or permanent injury, pain, disability to you, and death or injury to you or your unborn baby. If there is any problem keeping the appointment, you must call to reschedule. HOME CARE INSTRUCTIONS   Keep your prenatal appointments and tests as scheduled.   Tell your caregiver if you have any of the above risk factors.   Get plenty of rest and sleep.   Eat a balanced diet that is low in salt, and do not add salt  to your food.   Avoid stressful situations.   Only take over-the-counter and prescriptions medicines for pain, discomfort, or fever as directed by your caregiver.  SEEK IMMEDIATE MEDICAL CARE IF:   You develop severe swelling anywhere in the body. This usually occurs in the legs.   You gain 5 lb/2.3 kg or more in a week.   You develop a severe headache, dizziness, problems with your vision, or confusion.   You have abdominal pain,  nausea, or vomiting.   You have a seizure.   You have trouble moving any part of your body, or you develop numbness or problems speaking.   You have bruising or abnormal bleeding from anywhere in the body.   You develop a stiff neck.   You pass out.  MAKE SURE YOU:   Understand these instructions.   Will watch your condition.   Will get help right away if you are not doing well or get worse.  Document Released: 01/16/2000 Document Revised: 09/30/2010 Document Reviewed: 09/01/2007 [facility] Patient Information 2012 [place], [place]. Pregnancy - Second Trimester The second trimester of pregnancy (3 to 6 months) is a period of rapid growth for you and your baby. At the end of the sixth month, your baby is about 9 inches long and weighs 1 1/2 pounds. You will begin to feel the baby move between 18 and 20 weeks of the pregnancy. This is called quickening. Weight gain is faster. A clear fluid (colostrum) may leak out of your breasts. You may feel small contractions of the womb (uterus). This is known as false labor or Braxton-Hicks contractions. This is like a practice for labor when the baby is ready to be born. Usually, the problems with morning sickness have usually passed by the end of your first trimester. Some women develop small dark blotches (called cholasma, mask of pregnancy) on their face that usually goes away after the baby is born. Exposure to the sun makes the blotches worse. Acne may also develop in some pregnant women and pregnant women who have acne, may find that it goes away. PRENATAL EXAMS  Blood work may continue to be done during prenatal exams. These tests are done to check on your health and the probable health of your baby. Blood work is used to follow your blood levels (hemoglobin). Anemia (low hemoglobin) is common during pregnancy. Iron and vitamins are given to help prevent this. You will also be checked for diabetes between 24 and 28 weeks of the pregnancy. Some of the  previous blood tests may be repeated.   The size of the uterus is measured during each visit. This is to make sure that the baby is continuing to grow properly according to the dates of the pregnancy.   Your blood pressure is checked every prenatal visit. This is to make sure you are not getting toxemia.   Your urine is checked to make sure you do not have an infection, diabetes or protein in the urine.   Your weight is checked often to make sure gains are happening at the suggested rate. This is to ensure that both you and your baby are growing normally.   Sometimes, an ultrasound is performed to confirm the proper growth and development of the baby. This is a test which bounces harmless sound waves off the baby so your caregiver can more accurately determine due dates.  Sometimes, a specialized test is done on the amniotic fluid surrounding the baby. This test is called an amniocentesis. The amniotic  fluid is obtained by sticking a needle into the belly (abdomen). This is done to check the chromosomes in instances where there is a concern about possible genetic problems with the baby. It is also sometimes done near the end of pregnancy if an early delivery is required. In this case, it is done to help make sure the baby's lungs are mature enough for the baby to live outside of the womb. CHANGES OCCURING IN THE SECOND TRIMESTER OF PREGNANCY Your body goes through many changes during pregnancy. They vary from person to person. Talk to your caregiver about changes you notice that you are concerned about.  During the second trimester, you will likely have an increase in your appetite. It is normal to have cravings for certain foods. This varies from person to person and pregnancy to pregnancy.   Your lower abdomen will begin to bulge.   You may have to urinate more often because the uterus and baby are pressing on your bladder. It is also common to get more bladder infections during pregnancy (pain  with urination). You can help this by drinking lots of fluids and emptying your bladder before and after intercourse.   You may begin to get stretch marks on your hips, abdomen, and breasts. These are normal changes in the body during pregnancy. There are no exercises or medications to take that prevent this change.   You may begin to develop swollen and bulging veins (varicose veins) in your legs. Wearing support hose, elevating your feet for 15 minutes, 3 to 4 times a day and limiting salt in your diet helps lessen the problem.   Heartburn may develop as the uterus grows and pushes up against the stomach. Antacids recommended by your caregiver helps with this problem. Also, eating smaller meals 4 to 5 times a day helps.   Constipation can be treated with a stool softener or adding bulk to your diet. Drinking lots of fluids, vegetables, fruits, and whole grains are helpful.   Exercising is also helpful. If you have been very active up until your pregnancy, most of these activities can be continued during your pregnancy. If you have been less active, it is helpful to start an exercise program such as walking.   Hemorrhoids (varicose veins in the rectum) may develop at the end of the second trimester. Warm sitz baths and hemorrhoid cream recommended by your caregiver helps hemorrhoid problems.   Backaches may develop during this time of your pregnancy. Avoid heavy lifting, wear low heal shoes and practice good posture to help with backache problems.   Some pregnant women develop tingling and numbness of their hand and fingers because of swelling and tightening of ligaments in the wrist (carpel tunnel syndrome). This goes away after the baby is born.   As your breasts enlarge, you may have to get a bigger bra. Get a comfortable, cotton, support bra. Do not get a nursing bra until the last month of the pregnancy if you will be nursing the baby.   You may get a dark line from your belly button to the  pubic area called the linea nigra.   You may develop rosy cheeks because of increase blood flow to the face.   You may develop spider looking lines of the face, neck, arms and chest. These go away after the baby is born.  HOME CARE INSTRUCTIONS   It is extremely important to avoid all smoking, herbs, alcohol, and unprescribed drugs during your pregnancy. These chemicals affect the  formation and growth of the baby. Avoid these chemicals throughout the pregnancy to ensure the delivery of a healthy infant.   Most of your home care instructions are the same as suggested for the first trimester of your pregnancy. Keep your caregiver's appointments. Follow your caregiver's instructions regarding medication use, exercise and diet.   During pregnancy, you are providing food for you and your baby. Continue to eat regular, well-balanced meals. Choose foods such as meat, fish, milk and other low fat dairy products, vegetables, fruits, and whole-grain breads and cereals. Your caregiver will tell you of the ideal weight gain.   A physical sexual relationship may be continued up until near the end of pregnancy if there are no other problems. Problems could include early (premature) leaking of amniotic fluid from the membranes, vaginal bleeding, abdominal pain, or other medical or pregnancy problems.   Exercise regularly if there are no restrictions. Check with your caregiver if you are unsure of the safety of some of your exercises. The greatest weight gain will occur in the last 2 trimesters of pregnancy. Exercise will help you:   Control your weight.   Get you in shape for labor and delivery.   Lose weight after you have the baby.   Wear a good support or jogging bra for breast tenderness during pregnancy. This may help if worn during sleep. Pads or tissues may be used in the bra if you are leaking colostrum.   Do not use hot tubs, steam rooms or saunas throughout the pregnancy.   Wear your seat belt  at all times when driving. This protects you and your baby if you are in an accident.   Avoid raw meat, uncooked cheese, cat litter boxes and soil used by cats. These carry germs that can cause birth defects in the baby.   The second trimester is also a good time to visit your dentist for your dental health if this has not been done yet. Getting your teeth cleaned is OK. Use a soft toothbrush. Brush gently during pregnancy.   It is easier to loose urine during pregnancy. Tightening up and strengthening the pelvic muscles will help with this problem. Practice stopping your urination while you are going to the bathroom. These are the same muscles you need to strengthen. It is also the muscles you would use as if you were trying to stop from passing gas. You can practice tightening these muscles up 10 times a set and repeating this about 3 times per day. Once you know what muscles to tighten up, do not perform these exercises during urination. It is more likely to contribute to an infection by backing up the urine.   Ask for help if you have financial, counseling or nutritional needs during pregnancy. Your caregiver will be able to offer counseling for these needs as well as refer you for other special needs.   Your skin may become oily. If so, wash your face with mild soap, use non-greasy moisturizer and oil or cream based makeup.  MEDICATIONS AND DRUG USE IN PREGNANCY  Take prenatal vitamins as directed. The vitamin should contain 1 milligram of folic acid. Keep all vitamins out of reach of children. Only a couple vitamins or tablets containing iron may be fatal to a baby or young child when ingested.   Avoid use of all medications, including herbs, over-the-counter medications, not prescribed or suggested by your caregiver. Only take over-the-counter or prescription medicines for pain, discomfort, or fever as directed by your  caregiver. Do not use aspirin.   Let your caregiver also know about herbs  you may be using.   Alcohol is related to a number of birth defects. This includes fetal alcohol syndrome. All alcohol, in any form, should be avoided completely. Smoking will cause low birth rate and premature babies.   Street or illegal drugs are very harmful to the baby. They are absolutely forbidden. A baby born to an addicted mother will be addicted at birth. The baby will go through the same withdrawal an adult does.  SEEK MEDICAL CARE IF:  You have any concerns or worries during your pregnancy. It is better to call with your questions if you feel they cannot wait, rather than worry about them. SEEK IMMEDIATE MEDICAL CARE IF:   An unexplained oral temperature above 102 F (38.9 C) develops, or as your caregiver suggests.   You have leaking of fluid from the vagina (birth canal). If leaking membranes are suspected, take your temperature and tell your caregiver of this when you call.   There is vaginal spotting, bleeding, or passing clots. Tell your caregiver of the amount and how many pads are used. Light spotting in pregnancy is common, especially following intercourse.   You develop a bad smelling vaginal discharge with a change in the color from clear to white.   You continue to feel sick to your stomach (nauseated) and have no relief from remedies suggested. You vomit blood or coffee ground-like materials.   You lose more than 2 pounds of weight or gain more than 2 pounds of weight over 1 week, or as suggested by your caregiver.   You notice swelling of your face, hands, feet, or legs.   You get exposed to [place] measles and have never had them.   You are exposed to fifth disease or chickenpox.   You develop belly (abdominal) pain. Round ligament discomfort is a common non-cancerous (benign) cause of abdominal pain in pregnancy. Your caregiver still must evaluate you.   You develop a bad headache that does not go away.   You develop fever, diarrhea, pain with urination, or  shortness of breath.   You develop visual problems, blurry, or double vision.   You fall or are in a car accident or any kind of trauma.   There is mental or physical violence at home.  Document Released: 01/12/2001 Document Revised: 09/30/2010 Document Reviewed: 07/17/2008 [facility] Patient Information 2012 [place], [place]. Preeclampsia and Eclampsia Preeclampsia is a condition of high blood pressure during pregnancy. It can happen at 20 weeks or later in pregnancy. If high blood pressure occurs in the second half of pregnancy with no other symptoms, it is called gestational hypertension and goes away after the baby is born. If any of the symptoms listed below develop with gestational hypertension, it is then called preeclampsia. Eclampsia (convulsions) may follow preeclampsia. This is one of the reasons for regular prenatal checkups. Early diagnosis and treatment are very important to prevent eclampsia. CAUSES  There is no known cause of preeclampsia/eclampsia in pregnancy. There are several known conditions that may put the pregnant woman at risk, such as:  The first pregnancy.   Having preeclampsia in a past pregnancy.   Having lasting (chronic) high blood pressure.   Having multiples (twins, triplets).   Being age 31 or older.   African American ethnic background.   Having kidney disease or diabetes.   Medical conditions such as lupus or blood diseases.   Being overweight (obese).  SYMPTOMS  High blood pressure.   Headaches.   Sudden weight gain.   Swelling of hands, face, legs, and feet.   Protein in the urine.   Feeling sick to your stomach (nauseous) and throwing up (vomiting).   Vision problems (blurred or double vision).   Numbness in the face, arms, legs, and feet.   Dizziness.   Slurred speech.   Preeclampsia can cause growth retardation in the fetus.   Separation (abruption) of the placenta.   Not enough fluid in the amniotic sac  (oligohydramnios).   Sensitivity to bright lights.   Belly (abdominal) pain.  DIAGNOSIS  If protein is found in the urine in the second half of pregnancy, this is considered preeclampsia. Other symptoms mentioned above may also be present. TREATMENT  It is necessary to treat this.  Your caregiver may prescribe bed rest early in this condition. Plenty of rest and salt restriction may be all that is needed.   Medicines may be necessary to lower blood pressure if the condition does not respond to more conservative measures.   In more severe cases, hospitalization may be needed:   For treatment of blood pressure.   To control fluid retention.   To monitor the baby to see if the condition is causing harm to the baby.   Hospitalization is the best way to treat the first sign of preeclampsia. This is so the mother and baby can be watched closely and blood tests can be done effectively and correctly.   If the condition becomes severe, it may be necessary to induce labor or to remove the infant by surgical means (cesarean section). The best cure for preeclampsia/eclampsia is to deliver the baby.  Preeclampsia and eclampsia involve risks to mother and infant. Your caregiver will discuss these risks with you. Together, you can work out the best possible approach to your problems. Make sure you keep your prenatal visits as scheduled. Not keeping appointments could result in a chronic or permanent injury, pain, disability to you, and death or injury to you or your unborn baby. If there is any problem keeping the appointment, you must call to reschedule. HOME CARE INSTRUCTIONS   Keep your prenatal appointments and tests as scheduled.   Tell your caregiver if you have any of the above risk factors.   Get plenty of rest and sleep.   Eat a balanced diet that is low in salt, and do not add salt to your food.   Avoid stressful situations.   Only take over-the-counter and prescriptions medicines  for pain, discomfort, or fever as directed by your caregiver.  SEEK IMMEDIATE MEDICAL CARE IF:   You develop severe swelling anywhere in the body. This usually occurs in the legs.   You gain 5 lb/2.3 kg or more in a week.   You develop a severe headache, dizziness, problems with your vision, or confusion.   You have abdominal pain, nausea, or vomiting.   You have a seizure.   You have trouble moving any part of your body, or you develop numbness or problems speaking.   You have bruising or abnormal bleeding from anywhere in the body.   You develop a stiff neck.   You pass out.  MAKE SURE YOU:   Understand these instructions.   Will watch your condition.   Will get help right away if you are not doing well or get worse.  Document Released: 01/16/2000 Document Revised: 09/30/2010 Document Reviewed: 09/01/2007 [facility] Patient Information 2012 [place], [place].

## 2010-12-31 NOTE — Progress Notes (Signed)
U/S scheduled February 05, 2011 at 915 am.

## 2010-12-31 NOTE — Progress Notes (Signed)
Needs refill on pnv. Used interpreter from [employer]

## 2011-01-08 ENCOUNTER — Emergency Department (HOSPITAL_COMMUNITY)
Admission: EM | Admit: 2011-01-08 | Discharge: 2011-01-09 | Disposition: A | Discharge status: Home / Self Care | Payer: Medicaid Other | Attending: Emergency Medicine | Admitting: Emergency Medicine

## 2011-01-08 ENCOUNTER — Encounter (HOSPITAL_COMMUNITY): Payer: Self-pay | Admitting: *Deleted

## 2011-01-08 ENCOUNTER — Inpatient Hospital Stay (HOSPITAL_COMMUNITY)
Admission: AD | Admit: 2011-01-08 | Discharge: 2011-01-08 | Discharge status: Against Medical Advice | Payer: Medicaid Other | Source: Ambulatory Visit | Attending: Obstetrics & Gynecology | Admitting: Obstetrics & Gynecology

## 2011-01-08 DIAGNOSIS — R51 Headache: Secondary | ICD-10-CM | POA: Insufficient documentation

## 2011-01-08 DIAGNOSIS — O99891 Other specified diseases and conditions complicating pregnancy: Secondary | ICD-10-CM | POA: Insufficient documentation

## 2011-01-08 DIAGNOSIS — H9209 Otalgia, unspecified ear: Secondary | ICD-10-CM | POA: Insufficient documentation

## 2011-01-08 DIAGNOSIS — R591 Generalized enlarged lymph nodes: Secondary | ICD-10-CM

## 2011-01-08 DIAGNOSIS — R599 Enlarged lymph nodes, unspecified: Secondary | ICD-10-CM | POA: Insufficient documentation

## 2011-01-08 NOTE — Plan of Care (Signed)
Patient is not in the lobby when called to triage.  

## 2011-01-08 NOTE — Plan of Care (Signed)
Patient is not in the lobby when called to triage. Admissions states patient left down the hall with her family and did not return.

## 2011-01-08 NOTE — ED Notes (Addendum)
Per EMS- pt in c/o lump to back of her neck, area is not red, area is firm- pt also c/o headache- pt is [redacted] weeks pregnant

## 2011-01-09 NOTE — ED Provider Notes (Signed)
History     CSN: [id number] Arrival date & time: 01/08/2011 10:11 PM   First MD Initiated Contact with Patient 01/09/11 0111      Chief Complaint  Patient presents with  . Mass    (Consider location/radiation/quality/duration/timing/severity/associated sxs/prior treatment) HPI Comments: Patient here with lump to left posterior neck (occipital) that has been there and not changed for the past 10 years, also with similar lump to the right ear - no drainage from the areas, no fever or chills, no recent illness - is [redacted] weeks pregnant - is here with headache - has been taking tylenol without relief.  Patient is a 20 y.o. female presenting with ear pain. The history is provided by the patient and the spouse. The history is limited by a language barrier. No language interpreter was used.  Otalgia This is a chronic problem. The current episode started more than 1 week ago. There is pain in the right ear. The problem occurs constantly. The problem has not changed since onset.There has been no fever. The pain is at a severity of 4/10. The pain is moderate. Associated symptoms include headaches. Pertinent negatives include no ear discharge, no hearing loss, no rhinorrhea, no sore throat, no abdominal pain, no diarrhea, no vomiting, no neck pain, no cough and no rash.    Past Medical History  Diagnosis Date  . Chronic headaches   . Asthma   . Hypertension     Past Surgical History  Procedure Date  . Tonsillectomy 2007    Family History  Problem Relation Age of Onset  . Heart disease Mother   . Hypertension Mother   . Diabetes Mother   . Heart disease Father   . Diabetes Brother     History  Substance Use Topics  . Smoking status: Never Smoker   . Smokeless tobacco: Never Used  . Alcohol Use: No    OB History    Grav Para Term Preterm Abortions TAB SAB Ect Mult Living   2 1 1  0 0 0 0 0 0 1      Review of Systems  Unable to perform ROS: Other  HENT: Positive for ear pain.  Negative for hearing loss, sore throat, rhinorrhea, neck pain and ear discharge.   Respiratory: Negative for cough.   Gastrointestinal: Negative for vomiting, abdominal pain and diarrhea.  Skin: Negative for rash.  Neurological: Positive for headaches.    Allergies  Review of patient's allergies indicates no known allergies.  Home Medications   Current Outpatient Rx  Name Route Sig Dispense Refill  . ACETAMINOPHEN 500 MG PO TABS Oral Take 500 mg by mouth every 6 (six) hours as needed.      . ALBUTEROL SULFATE HFA 108 (90 BASE) MCG/ACT IN AERS Inhalation Inhale 2 puffs into the lungs every 6 (six) hours as needed.      Marland Kitchen NATALCARE PIC 60-1 MG PO TABS Oral Take 1 tablet by mouth daily. 30 tablet 6    BP 146/85  Pulse 123  Temp(Src) 98.7 F (37.1 C) (Oral)  SpO2 99%  LMP 07/19/2010  Physical Exam  Nursing note and vitals reviewed. Constitutional: She appears well-developed and well-nourished. No distress.  HENT:  Head: Normocephalic and atraumatic.  Left Ear: External ear normal.  Nose: Nose normal.  Mouth/Throat: Oropharynx is clear and moist. No oropharyngeal exudate.       Preauricular lymphadenopathy on right  Eyes: Conjunctivae are normal. Pupils are equal, round, and reactive to light.  Neck: Normal range of  motion. Neck supple.  Cardiovascular: Normal rate, regular rhythm and normal heart sounds.        Heart rate normalized at 96  Pulmonary/Chest: Effort normal and breath sounds normal. No respiratory distress.  Abdominal: Soft. Bowel sounds are normal. There is no tenderness.  Musculoskeletal: Normal range of motion.  Neurological: She is alert.  Skin: Skin is warm and dry.  Psychiatric: She has a normal mood and affect. Her behavior is normal. Judgment and thought content normal.    ED Course  Procedures (including critical care time)  Labs Reviewed - No data to display No results found.   Lymphadenopathy   MDM  Patient with lymphadenopathy, will need  further evaluation by PCP on these - has appointment with GYN on Thursday and will get referral then, no change in size in 10 years so this is reassuring.        Izola Price [place], [place] 01/09/11 0137  Medical screening examination/treatment/procedure(s) were performed by non-physician practitioner and as supervising physician I was immediately available for consultation/collaboration.   Sunnie Nielsen, MD 01/09/11 [phone]

## 2011-01-09 NOTE — ED Notes (Signed)
MD at bedside. 

## 2011-01-14 ENCOUNTER — Other Ambulatory Visit: Payer: Self-pay | Admitting: Obstetrics & Gynecology

## 2011-01-14 ENCOUNTER — Ambulatory Visit (INDEPENDENT_AMBULATORY_CARE_PROVIDER_SITE_OTHER): Payer: Medicaid Other | Admitting: Family Medicine

## 2011-01-14 DIAGNOSIS — O0990 Supervision of high risk pregnancy, unspecified, unspecified trimester: Secondary | ICD-10-CM | POA: Insufficient documentation

## 2011-01-14 DIAGNOSIS — O10019 Pre-existing essential hypertension complicating pregnancy, unspecified trimester: Secondary | ICD-10-CM

## 2011-01-14 LAB — POCT URINALYSIS DIP (DEVICE)
Ketones, ur: NEGATIVE mg/dL
Leukocytes, UA: NEGATIVE
Nitrite: NEGATIVE

## 2011-01-14 NOTE — Progress Notes (Signed)
Used Language Line: Arabic # [phone] Gtt and 28 wk labs at next visit

## 2011-01-14 NOTE — Progress Notes (Signed)
Patient seen a couple days ago for posterior cervical LAD.  Improving.  Non-tender.  No other symptoms.  Patient without complaints.  Denies vaginal bleeding, abnormal vaginal discharge, contractions, loss of fluid.  Denies HA, vision changes.  Reports good fetal activity.  Follow up in 2 weeks.

## 2011-01-28 ENCOUNTER — Encounter: Payer: Self-pay | Admitting: Family Medicine

## 2011-01-28 ENCOUNTER — Ambulatory Visit (INDEPENDENT_AMBULATORY_CARE_PROVIDER_SITE_OTHER): Payer: Medicaid Other | Admitting: Family Medicine

## 2011-01-28 DIAGNOSIS — O10019 Pre-existing essential hypertension complicating pregnancy, unspecified trimester: Secondary | ICD-10-CM

## 2011-01-28 DIAGNOSIS — O0990 Supervision of high risk pregnancy, unspecified, unspecified trimester: Secondary | ICD-10-CM

## 2011-01-28 DIAGNOSIS — I10 Essential (primary) hypertension: Secondary | ICD-10-CM

## 2011-01-28 LAB — POCT URINALYSIS DIP (DEVICE)
Glucose, UA: NEGATIVE mg/dL
Ketones, ur: NEGATIVE mg/dL
Leukocytes, UA: NEGATIVE
Specific Gravity, Urine: 1.025 (ref 1.005–1.030)
Urobilinogen, UA: 0.2 mg/dL (ref 0.0–1.0)

## 2011-01-28 LAB — CBC
Hemoglobin: 11.5 g/dL — ABNORMAL LOW (ref 12.0–15.0)
MCHC: 33 g/dL (ref 30.0–36.0)
Platelets: 223 10*3/uL (ref 150–400)
RBC: 4.3 MIL/uL (ref 3.87–5.11)

## 2011-01-28 NOTE — Patient Instructions (Signed)
Pregnancy - Third Trimester The third trimester begins at the 28th week of pregnancy and ends at birth. It is important to follow your doctor's instructions. HOME CARE   Go to your doctor's visits.   Do not smoke.   Do not drink alcohol or use drugs.   Only take medicine as told by your doctor.   Take prenatal vitamins as told. The vitamin should contain 1 milligram of folic acid.   Exercise.   Eat healthy foods. Eat regular, well-balanced meals.   You can have sex (intercourse) if there are no other problems with the pregnancy.   Do not use hot tubs, steam rooms, or saunas.   Wear a seat belt while driving.   Avoid raw meat, uncooked cheese, and litter boxes and soil used by cats.   Rest with your legs raised (elevated).   Make a list of emergency phone numbers. Keep this list with you.   Arrange for help when you come back home after delivering the baby.   Make a trial run to the hospital.   Take prenatal classes.   Prepare the baby's nursery.   Do not travel out of the city. If you absolutely have to, get permission from your doctor first.   Wear flat shoes. Do not wear high heels.  GET HELP RIGHT AWAY IF:   You have a temperature by mouth above 102 F (38.9 C), not controlled by medicine.   You have not felt the baby move for more than 1 hour. If you think the baby is not moving as much as normal, eat something with sugar in it or lie down on your left side for an hour. The baby should move at least 4 to 5 times per hour.   Fluid is coming from the vagina.   Blood is coming from the vagina. Light spotting is common, especially after sex (intercourse).   You have belly (abdominal) pain.   You have a bad smelling fluid (discharge) coming from the vagina. The fluid changes from clear to white.   You still feel sick to your stomach (nauseous).   You throw up (vomit) for more than 24 hours.   You have the chills.   You have shortness of breath.   You  have a burning feeling when you pee (urinate).   You lose or gain more than 2 pounds (0.9 kilograms) of weight over a week, or as told by your doctor.   Your face, hands, feet, or legs get puffy (swell).   You have a bad headache that will not go away.   You start to have problems seeing (blurry or double vision).   You fall, are in a car accident, or have any kind of trauma.   There is mental or physical violence at home.   You have any concerns or worries during your pregnancy.  MAKE SURE YOU:   Understand these instructions.   Will watch your condition.   Will get help right away if you are not doing well or get worse.  Document Released: 04/14/2009 Document Revised: 09/30/2010 Document Reviewed: 04/14/2009 [facility] Patient Information 2012 [place], [place].

## 2011-01-28 NOTE — Progress Notes (Signed)
Pulse 113. Pelvic pressure. 1 hr GTT is due at 9:20.

## 2011-01-28 NOTE — Progress Notes (Signed)
No complaints. Feels pressure on bladder.  No contractions, headaches, edema, vision changes.  Denies vaginal bleeding, d/c.   1hr GTT today.  [place] scheduled for 1/4.  Twice weekly testing starting 32wks.  Follow up in 2 weeks.

## 2011-01-29 LAB — GLUCOSE TOLERANCE, 1 HOUR: Glucose, 1 Hour GTT: 118 mg/dL (ref 70–140)

## 2011-01-29 LAB — RPR

## 2011-02-05 ENCOUNTER — Ambulatory Visit (HOSPITAL_COMMUNITY)
Admission: RE | Admit: 2011-02-05 | Discharge: 2011-02-05 | Disposition: A | Discharge status: Home / Self Care | Payer: Medicaid Other | Source: Ambulatory Visit | Attending: Physician Assistant | Admitting: Physician Assistant

## 2011-02-05 DIAGNOSIS — O0990 Supervision of high risk pregnancy, unspecified, unspecified trimester: Secondary | ICD-10-CM

## 2011-02-05 DIAGNOSIS — O139 Gestational [pregnancy-induced] hypertension without significant proteinuria, unspecified trimester: Secondary | ICD-10-CM | POA: Insufficient documentation

## 2011-02-05 DIAGNOSIS — O169 Unspecified maternal hypertension, unspecified trimester: Secondary | ICD-10-CM

## 2011-02-05 DIAGNOSIS — E669 Obesity, unspecified: Secondary | ICD-10-CM | POA: Insufficient documentation

## 2011-02-11 ENCOUNTER — Other Ambulatory Visit: Payer: Self-pay | Admitting: Physician Assistant

## 2011-02-11 ENCOUNTER — Telehealth: Payer: Self-pay | Admitting: *Deleted

## 2011-02-11 DIAGNOSIS — N12 Tubulo-interstitial nephritis, not specified as acute or chronic: Secondary | ICD-10-CM

## 2011-02-11 NOTE — Telephone Encounter (Signed)
Message copied by Gerome Apley on Thu Feb 11, 2011 12:57 PM ------      Message from: August Luz      Created: Tue Feb 09, 2011  5:11 PM       Needs FU [place] schedule to re-eval kidneys

## 2011-02-11 NOTE — Telephone Encounter (Signed)
Message copied by Gerome Apley on Thu Feb 11, 2011  1:16 PM ------      Message from: August Luz      Created: Tue Feb 09, 2011  5:11 PM       Needs FU [place] schedule to re-eval kidneys

## 2011-02-11 NOTE — Telephone Encounter (Signed)
Called patient home number with [facility] [phone], unable to leave message- heard message voicemail not set up.

## 2011-02-11 NOTE — Telephone Encounter (Signed)
Need to call patient with Arabic interpreter to tell her needs follow up ultrasound 03/10/11 at 0945

## 2011-02-15 NOTE — Telephone Encounter (Signed)
Pt has an appointment this week on Thursday for [facility]. Will make note to inform her of ultrasound appointment at that time.

## 2011-02-25 ENCOUNTER — Ambulatory Visit (INDEPENDENT_AMBULATORY_CARE_PROVIDER_SITE_OTHER): Payer: Medicaid Other | Admitting: Physician Assistant

## 2011-02-25 DIAGNOSIS — O0990 Supervision of high risk pregnancy, unspecified, unspecified trimester: Secondary | ICD-10-CM

## 2011-02-25 DIAGNOSIS — I10 Essential (primary) hypertension: Secondary | ICD-10-CM

## 2011-02-25 LAB — POCT URINALYSIS DIP (DEVICE)
Glucose, UA: NEGATIVE mg/dL
Leukocytes, UA: NEGATIVE
Nitrite: NEGATIVE
Protein, ur: 30 mg/dL — AB
Specific Gravity, Urine: 1.02 (ref 1.005–1.030)
Urobilinogen, UA: 1 mg/dL (ref 0.0–1.0)

## 2011-02-25 NOTE — Progress Notes (Signed)
Pt states that she has pain in her kidneys.

## 2011-02-25 NOTE — Patient Instructions (Signed)

## 2011-02-25 NOTE — Progress Notes (Signed)
No complaints. +FM daily. No pre-x s/s. Start antenatal testing at next visit. Growth [place] scheduled 03/10/11

## 2011-03-04 ENCOUNTER — Other Ambulatory Visit: Payer: Medicaid Other

## 2011-03-08 ENCOUNTER — Other Ambulatory Visit: Payer: Medicaid Other

## 2011-03-10 ENCOUNTER — Ambulatory Visit (HOSPITAL_COMMUNITY): Payer: Medicaid Other

## 2011-03-11 ENCOUNTER — Encounter: Payer: Self-pay | Admitting: Family Medicine

## 2011-03-11 ENCOUNTER — Ambulatory Visit (INDEPENDENT_AMBULATORY_CARE_PROVIDER_SITE_OTHER): Payer: Medicaid Other | Admitting: Family Medicine

## 2011-03-11 DIAGNOSIS — O10019 Pre-existing essential hypertension complicating pregnancy, unspecified trimester: Secondary | ICD-10-CM

## 2011-03-11 DIAGNOSIS — I10 Essential (primary) hypertension: Secondary | ICD-10-CM

## 2011-03-11 LAB — COMPREHENSIVE METABOLIC PANEL
ALT: 15 U/L (ref 0–35)
Albumin: 3.5 g/dL (ref 3.5–5.2)
CO2: 21 mEq/L (ref 19–32)
Chloride: 103 mEq/L (ref 96–112)
Glucose, Bld: 66 mg/dL — ABNORMAL LOW (ref 70–99)
Potassium: 4 mEq/L (ref 3.5–5.3)
Sodium: 133 mEq/L — ABNORMAL LOW (ref 135–145)
Total Bilirubin: 0.3 mg/dL (ref 0.3–1.2)
Total Protein: 7.2 g/dL (ref 6.0–8.3)

## 2011-03-11 LAB — POCT URINALYSIS DIP (DEVICE)
Bilirubin Urine: NEGATIVE
Glucose, UA: NEGATIVE mg/dL
Hgb urine dipstick: NEGATIVE
Nitrite: NEGATIVE
Urobilinogen, UA: 1 mg/dL (ref 0.0–1.0)

## 2011-03-11 LAB — CBC
Hemoglobin: 11.5 g/dL — ABNORMAL LOW (ref 12.0–15.0)
Platelets: 226 10*3/uL (ref 150–400)
RBC: 4.42 MIL/uL (ref 3.87–5.11)
WBC: 7.8 10*3/uL (ref 4.0–10.5)

## 2011-03-11 MED ORDER — ALBUTEROL SULFATE HFA 108 (90 BASE) MCG/ACT IN AERS: 2.0000 | INHALATION_SPRAY | Freq: Four times a day (QID) | RESPIRATORY_TRACT | Status: DC | PRN

## 2011-03-11 NOTE — Progress Notes (Signed)
NST reviewed and reactive.  U/S growth tomorrow.  BP up and 1 + protein, check PIH labs and 24 hour urine.

## 2011-03-11 NOTE — Progress Notes (Signed)
Pt needs refill on albuterol inhaler.  Pulse- 108.  Pain- lower back, pressure- pelvic

## 2011-03-11 NOTE — Patient Instructions (Addendum)
Preeclampsia and Eclampsia Preeclampsia is a condition of high blood pressure during pregnancy. It can happen at 20 weeks or later in pregnancy. If high blood pressure occurs in the second half of pregnancy with no other symptoms, it is called gestational hypertension and goes away after the baby is born. If any of the symptoms listed below develop with gestational hypertension, it is then called preeclampsia. Eclampsia (convulsions) may follow preeclampsia. This is one of the reasons for regular prenatal checkups. Early diagnosis and treatment are very important to prevent eclampsia. CAUSES  There is no known cause of preeclampsia/eclampsia in pregnancy. There are several known conditions that may put the pregnant woman at risk, such as:  The first pregnancy.   Having preeclampsia in a past pregnancy.   Having lasting (chronic) high blood pressure.   Having multiples (twins, triplets).   Being age 35 or older.   African American ethnic background.   Having kidney disease or diabetes.   Medical conditions such as lupus or blood diseases.   Being overweight (obese).  SYMPTOMS   High blood pressure.   Headaches.   Sudden weight gain.   Swelling of hands, face, legs, and feet.   Protein in the urine.   Feeling sick to your stomach (nauseous) and throwing up (vomiting).   Vision problems (blurred or double vision).   Numbness in the face, arms, legs, and feet.   Dizziness.   Slurred speech.   Preeclampsia can cause growth retardation in the fetus.   Separation (abruption) of the placenta.   Not enough fluid in the amniotic sac (oligohydramnios).   Sensitivity to bright lights.   Belly (abdominal) pain.  DIAGNOSIS  If protein is found in the urine in the second half of pregnancy, this is considered preeclampsia. Other symptoms mentioned above may also be present. TREATMENT  It is necessary to treat this.  Your caregiver may prescribe bed rest early in this  condition. Plenty of rest and salt restriction may be all that is needed.   Medicines may be necessary to lower blood pressure if the condition does not respond to more conservative measures.   In more severe cases, hospitalization may be needed:   For treatment of blood pressure.   To control fluid retention.   To monitor the baby to see if the condition is causing harm to the baby.   Hospitalization is the best way to treat the first sign of preeclampsia. This is so the mother and baby can be watched closely and blood tests can be done effectively and correctly.   If the condition becomes severe, it may be necessary to induce labor or to remove the infant by surgical means (cesarean section). The best cure for preeclampsia/eclampsia is to deliver the baby.  Preeclampsia and eclampsia involve risks to mother and infant. Your caregiver will discuss these risks with you. Together, you can work out the best possible approach to your problems. Make sure you keep your prenatal visits as scheduled. Not keeping appointments could result in a chronic or permanent injury, pain, disability to you, and death or injury to you or your unborn baby. If there is any problem keeping the appointment, you must call to reschedule. HOME CARE INSTRUCTIONS   Keep your prenatal appointments and tests as scheduled.   Tell your caregiver if you have any of the above risk factors.   Get plenty of rest and sleep.   Eat a balanced diet that is low in salt, and do not add salt   to your food.   Avoid stressful situations.   Only take over-the-counter and prescriptions medicines for pain, discomfort, or fever as directed by your caregiver.  SEEK IMMEDIATE MEDICAL CARE IF:   You develop severe swelling anywhere in the body. This usually occurs in the legs.   You gain 5 lb/2.3 kg or more in a week.   You develop a severe headache, dizziness, problems with your vision, or confusion.   You have abdominal pain,  nausea, or vomiting.   You have a seizure.   You have trouble moving any part of your body, or you develop numbness or problems speaking.   You have bruising or abnormal bleeding from anywhere in the body.   You develop a stiff neck.   You pass out.  MAKE SURE YOU:   Understand these instructions.   Will watch your condition.   Will get help right away if you are not doing well or get worse.  Document Released: 01/16/2000 Document Revised: 09/30/2010 Document Reviewed: 09/01/2007 [employer] Patient Information 2012 [employer].Breastfeeding BENEFITS OF BREASTFEEDING For the baby  The first milk (colostrum) helps the baby's digestive system function better.   There are antibodies from the mother in the milk that help the baby fight off infections.   The baby has a lower incidence of asthma, allergies, and SIDS (sudden infant death syndrome).   The nutrients in breast milk are better than formulas for the baby and helps the baby's brain grow better.   Babies who breastfeed have less gas, colic, and constipation.  For the mother  Breastfeeding helps develop a very special bond between mother and baby.   It is more convenient, always available at the correct temperature and cheaper than formula feeding.   It burns calories in the mother and helps with losing weight that was gained during pregnancy.   It makes the uterus contract back down to normal size faster and slows bleeding following delivery.   Breastfeeding mothers have a lower risk of developing breast cancer.  NURSE FREQUENTLY  A healthy, full-term baby may breastfeed as often as every hour or space his or her feedings to every 3 hours.   How often to nurse will vary from baby to baby. Watch your baby for signs of hunger, not the clock.   Nurse as often as the baby requests, or when you feel the need to reduce the fullness of your breasts.   Awaken the baby if it has been 3 to 4 hours since the last feeding.     Frequent feeding will help the mother make more milk and will prevent problems like sore nipples and engorgement of the breasts.  BABY'S POSITION AT THE BREAST  Whether lying down or sitting, be sure that the baby's tummy is facing your tummy.   Support the breast with 4 fingers underneath the breast and the thumb above. Make sure your fingers are well away from the nipple and baby's mouth.   Stroke the baby's lips and cheek closest to the breast gently with your finger or nipple.   When the baby's mouth is open wide enough, place all of your nipple and as much of the dark area around the nipple as possible into your baby's mouth.   Pull the baby in close so the tip of the nose and the baby's cheeks touch the breast during the feeding.  FEEDINGS  The length of each feeding varies from baby to baby and from feeding to feeding.   The   baby must suck about 2 to 3 minutes for your milk to get to him or her. This is called a "let down." For this reason, allow the baby to feed on each breast as long as he or she wants. Your baby will end the feeding when he or she has received the right balance of nutrients.   To break the suction, put your finger into the corner of the baby's mouth and slide it between his or her gums before removing your breast from his or her mouth. This will help prevent sore nipples.  REDUCING BREAST ENGORGEMENT  In the first week after your baby is born, you may experience signs of breast engorgement. When breasts are engorged, they feel heavy, warm, full, and may be tender to the touch. You can reduce engorgement if you:   Nurse frequently, every 2 to 3 hours. Mothers who breastfeed early and often have fewer problems with engorgement.   Place light ice packs on your breasts between feedings. This reduces swelling. Wrap the ice packs in a lightweight towel to protect your skin.   Apply moist hot packs to your breast for 5 to 10 minutes before each feeding. This  increases circulation and helps the milk flow.   Gently massage your breast before and during the feeding.   Make sure that the baby empties at least one breast at every feeding before switching sides.   Use a breast pump to empty the breasts if your baby is sleepy or not nursing well. You may also want to pump if you are returning to work or or you feel you are getting engorged.   Avoid bottle feeds, pacifiers or supplemental feedings of water or juice in place of breastfeeding.   Be sure the baby is latched on and positioned properly while breastfeeding.   Prevent fatigue, stress, and anemia.   Wear a supportive bra, avoiding underwire styles.   Eat a balanced diet with enough fluids.  If you follow these suggestions, your engorgement should improve in 24 to 48 hours. If you are still experiencing difficulty, call your lactation consultant or caregiver. IS MY BABY GETTING ENOUGH MILK? Sometimes, mothers worry about whether their babies are getting enough milk. You can be assured that your baby is getting enough milk if:  The baby is actively sucking and you hear swallowing.   The baby nurses at least 8 to 12 times in a 24 hour time period. Nurse your baby until he or she unlatches or falls asleep at the first breast (at least 10 to 20 minutes), then offer the second side.   The baby is wetting 5 to 6 disposable diapers (6 to 8 cloth diapers) in a 24 hour period by 5 to 6 days of age.   The baby is having at least 2 to 3 stools every 24 hours for the first few months. Breast milk is all the food your baby needs. It is not necessary for your baby to have water or formula. In fact, to help your breasts make more milk, it is best not to give your baby supplemental feedings during the early weeks.   The stool should be soft and yellow.   The baby should gain 4 to 7 ounces per week after he is 4 days old.  TAKE CARE OF YOURSELF Take care of your breasts by:  Bathing or showering daily.    Avoiding the use of soaps on your nipples.   Start feedings on your left breast at   one feeding and on your right breast at the next feeding.   You will notice an increase in your milk supply 2 to 5 days after delivery. You may feel some discomfort from engorgement, which makes your breasts very firm and often tender. Engorgement "peaks" out within 24 to 48 hours. In the meantime, apply warm moist towels to your breasts for 5 to 10 minutes before feeding. Gentle massage and expression of some milk before feeding will soften your breasts, making it easier for your baby to latch on. Wear a well fitting nursing bra and air dry your nipples for 10 to 15 minutes after each feeding.   Only use cotton bra pads.   Only use pure lanolin on your nipples after nursing. You do not need to wash it off before nursing.  Take care of yourself by:   Eating well-balanced meals and nutritious snacks.   Drinking milk, fruit juice, and water to satisfy your thirst (about 8 glasses a day).   Getting plenty of rest.   Increasing calcium in your diet (1200 mg a day).   Avoiding foods that you notice affect the baby in a bad way.  SEEK MEDICAL CARE IF:   You have any questions or difficulty with breastfeeding.   You need help.   You have a hard, red, sore area on your breast, accompanied by a fever of 100.5 F (38.1 C) or more.   Your baby is too sleepy to eat well or is having trouble sleeping.   Your baby is wetting less than 6 diapers per day, by 5 days of age.   Your baby's skin or white part of his or her eyes is more yellow than it was in the hospital.   You feel depressed.  Document Released: 01/18/2005 Document Revised: 09/30/2010 Document Reviewed: 09/02/2008 [employer] Patient Information 2012 [employer]. 

## 2011-03-12 ENCOUNTER — Ambulatory Visit (HOSPITAL_COMMUNITY)
Admission: RE | Admit: 2011-03-12 | Discharge: 2011-03-12 | Disposition: A | Discharge status: Home / Self Care | Payer: Medicaid Other | Source: Ambulatory Visit | Attending: Physician Assistant | Admitting: Physician Assistant

## 2011-03-12 DIAGNOSIS — O99210 Obesity complicating pregnancy, unspecified trimester: Secondary | ICD-10-CM | POA: Insufficient documentation

## 2011-03-12 DIAGNOSIS — E669 Obesity, unspecified: Secondary | ICD-10-CM | POA: Insufficient documentation

## 2011-03-12 DIAGNOSIS — I10 Essential (primary) hypertension: Secondary | ICD-10-CM

## 2011-03-12 DIAGNOSIS — O139 Gestational [pregnancy-induced] hypertension without significant proteinuria, unspecified trimester: Secondary | ICD-10-CM | POA: Insufficient documentation

## 2011-03-12 DIAGNOSIS — O358XX0 Maternal care for other (suspected) fetal abnormality and damage, not applicable or unspecified: Secondary | ICD-10-CM | POA: Insufficient documentation

## 2011-03-15 ENCOUNTER — Ambulatory Visit (INDEPENDENT_AMBULATORY_CARE_PROVIDER_SITE_OTHER): Payer: Medicaid Other | Admitting: *Deleted

## 2011-03-15 VITALS — BP 130/76

## 2011-03-15 DIAGNOSIS — O10019 Pre-existing essential hypertension complicating pregnancy, unspecified trimester: Secondary | ICD-10-CM

## 2011-03-15 LAB — PROTEIN, URINE, 24 HOUR
Protein, 24H Urine: 75 mg/d (ref 50–100)
Protein, Urine: 5 mg/dL

## 2011-03-15 LAB — CREATININE CLEARANCE, URINE, 24 HOUR: Creatinine, 24H Ur: 1272 mg/d (ref 700–1800)

## 2011-03-15 NOTE — Progress Notes (Signed)
P = 120   Interpreter -  Fatima Dafalla  Pt c/o Lt upper abdominal pain which is intermittent and back pain which is constant.  Pt denies H/A or visual disturbances. Labs from 2/7 reviewed- normal, 24 hr urine results are pending.  Pt instructed to return to hospital for worsening abd pain or visual disturbances with or without H/A.  Comfort measures encouraged for back pain.

## 2011-03-18 ENCOUNTER — Ambulatory Visit (INDEPENDENT_AMBULATORY_CARE_PROVIDER_SITE_OTHER): Payer: Medicaid Other | Admitting: Physician Assistant

## 2011-03-18 VITALS — BP 139/88 | Temp 97.8°F | Wt 221.7 lb

## 2011-03-18 DIAGNOSIS — O10019 Pre-existing essential hypertension complicating pregnancy, unspecified trimester: Secondary | ICD-10-CM

## 2011-03-18 LAB — POCT URINALYSIS DIP (DEVICE)
Bilirubin Urine: NEGATIVE
Ketones, ur: NEGATIVE mg/dL
Protein, ur: NEGATIVE mg/dL
Specific Gravity, Urine: 1.025 (ref 1.005–1.030)
pH: 6.5 (ref 5.0–8.0)

## 2011-03-18 NOTE — Progress Notes (Signed)
No complaints. +FM daily, no s/s Pre-x. Reactive NST today

## 2011-03-18 NOTE — Progress Notes (Signed)
Edema-feet.  Pain/pressure- ligament pain Vaginal discharge-"sometimes white and sometimes yellow"

## 2011-03-18 NOTE — Progress Notes (Signed)
Pt instructed to increase po fluids due to low nml AFI

## 2011-03-18 NOTE — Patient Instructions (Signed)

## 2011-03-22 ENCOUNTER — Telehealth: Payer: Self-pay

## 2011-03-22 ENCOUNTER — Ambulatory Visit (INDEPENDENT_AMBULATORY_CARE_PROVIDER_SITE_OTHER): Payer: Medicaid Other | Admitting: *Deleted

## 2011-03-22 ENCOUNTER — Other Ambulatory Visit: Payer: Medicaid Other

## 2011-03-22 VITALS — BP 140/69 | Wt 222.0 lb

## 2011-03-22 DIAGNOSIS — O10019 Pre-existing essential hypertension complicating pregnancy, unspecified trimester: Secondary | ICD-10-CM

## 2011-03-22 NOTE — Telephone Encounter (Signed)
Pt called to cancel appt for Monday for NST @ 1pm.  I informed Diane and the front desk of the cancellation.

## 2011-03-22 NOTE — Progress Notes (Signed)
P = 100  NST only today 

## 2011-03-23 NOTE — Progress Notes (Signed)
NST 03/15/11 reviewed, reactive

## 2011-03-25 ENCOUNTER — Other Ambulatory Visit: Payer: Medicaid Other

## 2011-03-29 ENCOUNTER — Ambulatory Visit (INDEPENDENT_AMBULATORY_CARE_PROVIDER_SITE_OTHER): Payer: Medicaid Other | Admitting: *Deleted

## 2011-03-29 VITALS — BP 122/72 | Wt 225.3 lb

## 2011-03-29 DIAGNOSIS — O10019 Pre-existing essential hypertension complicating pregnancy, unspecified trimester: Secondary | ICD-10-CM

## 2011-03-29 NOTE — Progress Notes (Signed)
P = 102   Pt denies fluid leaking from vagina - encouraged again to increase po fluids.

## 2011-03-30 NOTE — Progress Notes (Addendum)
NST performed on 03/30/2011 was reviewed and was found to be reactive.  AFI 9.7 cm Continue recommended antenatal testing and prenatal care.

## 2011-04-01 ENCOUNTER — Other Ambulatory Visit: Payer: Medicaid Other

## 2011-04-05 ENCOUNTER — Ambulatory Visit (INDEPENDENT_AMBULATORY_CARE_PROVIDER_SITE_OTHER): Payer: Medicaid Other | Admitting: *Deleted

## 2011-04-05 VITALS — BP 134/69 | Wt 224.0 lb

## 2011-04-05 DIAGNOSIS — O10019 Pre-existing essential hypertension complicating pregnancy, unspecified trimester: Secondary | ICD-10-CM

## 2011-04-05 DIAGNOSIS — O4100X0 Oligohydramnios, unspecified trimester, not applicable or unspecified: Secondary | ICD-10-CM | POA: Insufficient documentation

## 2011-04-05 NOTE — Progress Notes (Signed)
P = 110     Pt denies leaking of fluid from vagina. Pt instructed to increase po fluid intake and return to hospital for sx of ROM or decr. FM.  Language Resources interpreter present for visit.   Pt has Ob f/u visit on 04/08/11.

## 2011-04-08 ENCOUNTER — Ambulatory Visit (INDEPENDENT_AMBULATORY_CARE_PROVIDER_SITE_OTHER): Payer: Medicaid Other | Admitting: Obstetrics and Gynecology

## 2011-04-08 DIAGNOSIS — E669 Obesity, unspecified: Secondary | ICD-10-CM

## 2011-04-08 DIAGNOSIS — O10019 Pre-existing essential hypertension complicating pregnancy, unspecified trimester: Secondary | ICD-10-CM

## 2011-04-08 DIAGNOSIS — O4100X0 Oligohydramnios, unspecified trimester, not applicable or unspecified: Secondary | ICD-10-CM

## 2011-04-08 DIAGNOSIS — I10 Essential (primary) hypertension: Secondary | ICD-10-CM

## 2011-04-08 LAB — POCT URINALYSIS DIP (DEVICE)
Glucose, UA: NEGATIVE mg/dL
Hgb urine dipstick: NEGATIVE
Ketones, ur: NEGATIVE mg/dL
Protein, ur: NEGATIVE mg/dL
Specific Gravity, Urine: <=1.005 (ref 1.005–1.030)

## 2011-04-08 LAB — STREP B DNA PROBE: GBS: NEGATIVE

## 2011-04-08 NOTE — Progress Notes (Signed)
3/4 NST reviewed and reactive

## 2011-04-08 NOTE — Progress Notes (Signed)
Pulse- 118  Pain/pressure- lower back, legs, pelvic Pt needs Rx for cold

## 2011-04-08 NOTE — Progress Notes (Signed)
3/7 NST reviewed and reactive. Patient doing well without complaints. FM/labor precautions reviewed. Cultures done today

## 2011-04-09 LAB — GC/CHLAMYDIA PROBE AMP, GENITAL: Chlamydia, DNA Probe: NEGATIVE

## 2011-04-11 LAB — CULTURE, BETA STREP (GROUP B ONLY)

## 2011-04-12 ENCOUNTER — Ambulatory Visit (INDEPENDENT_AMBULATORY_CARE_PROVIDER_SITE_OTHER): Payer: Medicaid Other | Admitting: *Deleted

## 2011-04-12 DIAGNOSIS — O10019 Pre-existing essential hypertension complicating pregnancy, unspecified trimester: Secondary | ICD-10-CM

## 2011-04-12 DIAGNOSIS — I10 Essential (primary) hypertension: Secondary | ICD-10-CM

## 2011-04-12 DIAGNOSIS — O4100X0 Oligohydramnios, unspecified trimester, not applicable or unspecified: Secondary | ICD-10-CM

## 2011-04-12 NOTE — Progress Notes (Signed)
P=111 

## 2011-04-15 ENCOUNTER — Ambulatory Visit (INDEPENDENT_AMBULATORY_CARE_PROVIDER_SITE_OTHER): Payer: Medicaid Other | Admitting: Obstetrics & Gynecology

## 2011-04-15 VITALS — BP 135/85 | Temp 98.7°F | Wt 223.7 lb

## 2011-04-15 DIAGNOSIS — O10019 Pre-existing essential hypertension complicating pregnancy, unspecified trimester: Secondary | ICD-10-CM

## 2011-04-15 DIAGNOSIS — O4100X0 Oligohydramnios, unspecified trimester, not applicable or unspecified: Secondary | ICD-10-CM

## 2011-04-15 LAB — POCT URINALYSIS DIP (DEVICE)
Bilirubin Urine: NEGATIVE
Glucose, UA: NEGATIVE mg/dL
Hgb urine dipstick: NEGATIVE
Ketones, ur: NEGATIVE mg/dL
Ketones, ur: NEGATIVE mg/dL
Nitrite: NEGATIVE
Nitrite: NEGATIVE
pH: 6.5 (ref 5.0–8.0)

## 2011-04-15 LAB — FETAL NONSTRESS TEST

## 2011-04-15 NOTE — Progress Notes (Signed)
Chronic hypertensive and oligo.  Getting NST.  Induction on Sunday 7:30 p.m.  Foley bulb.

## 2011-04-15 NOTE — Progress Notes (Signed)
P=107, Used [employer] [phone], Dr. Penne Lash spoke with pt. Via interpreter re: Induction for Sunday,

## 2011-04-16 ENCOUNTER — Telehealth (HOSPITAL_COMMUNITY): Payer: Self-pay | Admitting: *Deleted

## 2011-04-16 NOTE — Telephone Encounter (Signed)
Preadmission screen  

## 2011-04-18 ENCOUNTER — Inpatient Hospital Stay (HOSPITAL_COMMUNITY)
Admission: RE | Admit: 2011-04-18 | Discharge: 2011-04-21 | DRG: 774 | Disposition: A | Discharge status: Home / Self Care | Payer: Medicaid Other | Source: Ambulatory Visit | Attending: Obstetrics & Gynecology | Admitting: Obstetrics & Gynecology

## 2011-04-18 ENCOUNTER — Encounter (HOSPITAL_COMMUNITY): Payer: Self-pay

## 2011-04-18 DIAGNOSIS — I10 Essential (primary) hypertension: Secondary | ICD-10-CM

## 2011-04-18 DIAGNOSIS — O10019 Pre-existing essential hypertension complicating pregnancy, unspecified trimester: Secondary | ICD-10-CM

## 2011-04-18 DIAGNOSIS — O1002 Pre-existing essential hypertension complicating childbirth: Principal | ICD-10-CM | POA: Diagnosis present

## 2011-04-18 DIAGNOSIS — E669 Obesity, unspecified: Secondary | ICD-10-CM

## 2011-04-18 DIAGNOSIS — O4100X0 Oligohydramnios, unspecified trimester, not applicable or unspecified: Secondary | ICD-10-CM | POA: Diagnosis present

## 2011-04-18 DIAGNOSIS — Z603 Acculturation difficulty: Secondary | ICD-10-CM

## 2011-04-18 LAB — CBC
HCT: 34.7 % — ABNORMAL LOW (ref 36.0–46.0)
Hemoglobin: 11.6 g/dL — ABNORMAL LOW (ref 12.0–15.0)
MCH: 27.1 pg (ref 26.0–34.0)
MCHC: 33.4 g/dL (ref 30.0–36.0)
RBC: 4.28 MIL/uL (ref 3.87–5.11)

## 2011-04-18 MED ORDER — OXYCODONE-ACETAMINOPHEN 5-325 MG PO TABS: 1.0000 | ORAL_TABLET | ORAL | Status: DC | PRN

## 2011-04-18 MED ORDER — TERBUTALINE SULFATE 1 MG/ML IJ SOLN: 0.2500 mg | Freq: Once | INTRAMUSCULAR | Status: AC | PRN

## 2011-04-18 MED ORDER — OXYTOCIN 20 UNITS IN LACTATED RINGERS INFUSION - SIMPLE
125.0000 mL/h | Freq: Once | INTRAVENOUS | Status: AC
  Administered 2011-04-19: 125 mL/h via INTRAVENOUS

## 2011-04-18 MED ORDER — ZOLPIDEM TARTRATE 10 MG PO TABS: 10.0000 mg | ORAL_TABLET | Freq: Every evening | ORAL | Status: DC | PRN

## 2011-04-18 MED ORDER — IBUPROFEN 600 MG PO TABS
600.0000 mg | ORAL_TABLET | Freq: Four times a day (QID) | ORAL | Status: DC | PRN
  Administered 2011-04-19: 600 mg via ORAL
  Filled 2011-04-18: qty 1

## 2011-04-18 MED ORDER — LACTATED RINGERS IV SOLN: 500.0000 mL | INTRAVENOUS | Status: DC | PRN

## 2011-04-18 MED ORDER — LACTATED RINGERS IV SOLN
INTRAVENOUS | Status: DC
  Administered 2011-04-19: 05:00:00 via INTRAVENOUS

## 2011-04-18 MED ORDER — ACETAMINOPHEN 325 MG PO TABS: 650.0000 mg | ORAL_TABLET | ORAL | Status: DC | PRN

## 2011-04-18 MED ORDER — LIDOCAINE HCL (PF) 1 % IJ SOLN
30.0000 mL | INTRAMUSCULAR | Status: DC | PRN
  Filled 2011-04-18: qty 30

## 2011-04-18 MED ORDER — CITRIC ACID-SODIUM CITRATE 334-500 MG/5ML PO SOLN: 30.0000 mL | ORAL | Status: DC | PRN

## 2011-04-18 MED ORDER — ONDANSETRON HCL 4 MG/2ML IJ SOLN: 4.0000 mg | Freq: Four times a day (QID) | INTRAMUSCULAR | Status: DC | PRN

## 2011-04-18 MED ORDER — NALBUPHINE HCL 10 MG/ML IJ SOLN
10.0000 mg | INTRAMUSCULAR | Status: DC | PRN
  Administered 2011-04-19: 10 mg via INTRAVENOUS
  Filled 2011-04-18: qty 1

## 2011-04-18 MED ORDER — MISOPROSTOL 25 MCG QUARTER TABLET
25.0000 ug | ORAL_TABLET | ORAL | Status: DC | PRN
  Administered 2011-04-18: 25 ug via VAGINAL
  Filled 2011-04-18: qty 0.25

## 2011-04-18 MED ORDER — OXYTOCIN BOLUS FROM INFUSION
500.0000 mL | Freq: Once | INTRAVENOUS | Status: DC
  Filled 2011-04-18: qty 500
  Filled 2011-04-18: qty 1000

## 2011-04-18 MED ORDER — FLEET ENEMA 7-19 GM/118ML RE ENEM: 1.0000 | ENEMA | RECTAL | Status: DC | PRN

## 2011-04-18 NOTE — H&P (Signed)
Tina Buchanan is a 21 y.o. female G2P1001 at [redacted]w[redacted]d presenting for IOL for chronic HTN and Oligo. Patient gets prenatal care at [facility]. Patient denies contractions, LOF, vaginal bleeding. She reports good fetal movement. No headache, changes in vision, pain in chest. She does not take medications at home other than prenatal vitamin. Plans to breast and bottle feed.  Arabic interpreter via [employer] interpreter used throughout entire H&P  Maternal Medical History:  Reason for admission: Reason for Admission:   nausea  OB History    Grav Para Term Preterm Abortions TAB SAB Ect Mult Living   2 1 1  0 0 0 0 0 0 1     Past Medical History  Diagnosis Date  . Chronic headaches   . Asthma   . Hypertension    Past Surgical History  Procedure Date  . Tonsillectomy 2007   Family History: family history includes Diabetes in her brother and mother; Heart disease in her father and mother; and Hypertension in her mother. Social History:  reports that she has never smoked. She has never used smokeless tobacco. She reports that she does not drink alcohol or use illicit drugs.  Review of Systems  Constitutional: Negative for fever and chills.  Eyes: Negative for blurred vision.  Respiratory: Negative for shortness of breath.   Cardiovascular: Negative for chest pain.  Gastrointestinal: Negative for nausea and vomiting.  Genitourinary: Negative for dysuria.  Skin: Negative for rash.  Neurological: Negative for dizziness and headaches.      Blood pressure 131/80, pulse 105, temperature 99 F (37.2 C), temperature source Oral, resp. rate 20, height 5' 4.5" (1.638 m), weight 104.327 kg (230 lb), last menstrual period 07/19/2010. Exam Physical Exam  Constitutional: She is oriented to person, place, and time. She appears well-developed and well-nourished. No distress.  HENT:  Head: Normocephalic and atraumatic.  Neck: Normal range of motion.  Cardiovascular: Normal rate and regular  rhythm.   No murmur heard. Respiratory: Effort normal and breath sounds normal. She has no wheezes.  GI: Soft.       Gravid. Toco in place.  Musculoskeletal: Normal range of motion. She exhibits no edema and no tenderness.  Neurological: She is alert and oriented to person, place, and time. No cranial nerve deficit.  Skin: Skin is dry. No rash noted.    Prenatal labs: ABO, Rh: B/Positive/-- (08/29 0000) Antibody: Negative (08/29 0000) Rubella: Immune (08/29 0000) RPR: NON REAC (12/27 0859)  HBsAg: Negative (08/29 0000)  HIV: Non-reactive (08/29 0000)  GBS:   Neg  Assessment/Plan: 21 yo G2P1001 at [redacted]w[redacted]d presenting for IOL due to chronic HTN and oligo. - Admit to birthing suite; attending Dr. Despina Hidden - Cervix 2.5/70/-3/vertex. No contractions noted on monitor. FHT reassuring - Will give Cytotec; plan to start Pitocin at 0500 - Epidural when appropriate - Continue to monitor blood pressures - Continue active management - Plan discussed with Zerita Boers, CNM who agrees with above plan    Ed Mandich 04/18/2011, 9:01 PM

## 2011-04-19 ENCOUNTER — Encounter (HOSPITAL_COMMUNITY): Payer: Self-pay

## 2011-04-19 ENCOUNTER — Encounter (HOSPITAL_COMMUNITY): Payer: Self-pay | Admitting: Anesthesiology

## 2011-04-19 ENCOUNTER — Other Ambulatory Visit: Payer: Medicaid Other

## 2011-04-19 ENCOUNTER — Inpatient Hospital Stay (HOSPITAL_COMMUNITY): Payer: Medicaid Other | Admitting: Anesthesiology

## 2011-04-19 DIAGNOSIS — O1002 Pre-existing essential hypertension complicating childbirth: Secondary | ICD-10-CM

## 2011-04-19 DIAGNOSIS — O4100X0 Oligohydramnios, unspecified trimester, not applicable or unspecified: Secondary | ICD-10-CM

## 2011-04-19 LAB — RPR: RPR Ser Ql: NONREACTIVE

## 2011-04-19 MED ORDER — TETANUS-DIPHTH-ACELL PERTUSSIS 5-2.5-18.5 LF-MCG/0.5 IM SUSP: 0.5000 mL | Freq: Once | INTRAMUSCULAR | Status: DC

## 2011-04-19 MED ORDER — DIPHENHYDRAMINE HCL 50 MG/ML IJ SOLN: 12.5000 mg | INTRAMUSCULAR | Status: DC | PRN

## 2011-04-19 MED ORDER — ZOLPIDEM TARTRATE 5 MG PO TABS: 5.0000 mg | ORAL_TABLET | Freq: Every evening | ORAL | Status: DC | PRN

## 2011-04-19 MED ORDER — FENTANYL 2.5 MCG/ML BUPIVACAINE 1/10 % EPIDURAL INFUSION (WH - ANES)
14.0000 mL/h | INTRAMUSCULAR | Status: DC
  Administered 2011-04-19 (×3): 14 mL/h via EPIDURAL
  Filled 2011-04-19 (×3): qty 60

## 2011-04-19 MED ORDER — PRENATAL MULTIVITAMIN CH
1.0000 | ORAL_TABLET | Freq: Every day | ORAL | Status: DC
  Administered 2011-04-19 – 2011-04-21 (×3): 1 via ORAL
  Filled 2011-04-19 (×3): qty 1

## 2011-04-19 MED ORDER — ONDANSETRON HCL 4 MG/2ML IJ SOLN: 4.0000 mg | INTRAMUSCULAR | Status: DC | PRN

## 2011-04-19 MED ORDER — SIMETHICONE 80 MG PO CHEW: 80.0000 mg | CHEWABLE_TABLET | ORAL | Status: DC | PRN

## 2011-04-19 MED ORDER — EPHEDRINE 5 MG/ML INJ
10.0000 mg | INTRAVENOUS | Status: DC | PRN
  Filled 2011-04-19: qty 4

## 2011-04-19 MED ORDER — PHENYLEPHRINE 40 MCG/ML (10ML) SYRINGE FOR IV PUSH (FOR BLOOD PRESSURE SUPPORT)
80.0000 ug | PREFILLED_SYRINGE | INTRAVENOUS | Status: DC | PRN
  Filled 2011-04-19: qty 5

## 2011-04-19 MED ORDER — EPHEDRINE 5 MG/ML INJ: 10.0000 mg | INTRAVENOUS | Status: DC | PRN

## 2011-04-19 MED ORDER — LACTATED RINGERS IV SOLN: 500.0000 mL | Freq: Once | INTRAVENOUS | Status: DC

## 2011-04-19 MED ORDER — NALBUPHINE SYRINGE 5 MG/0.5 ML
10.0000 mg | INJECTION | INTRAMUSCULAR | Status: DC | PRN
  Filled 2011-04-19 (×2): qty 1

## 2011-04-19 MED ORDER — DIPHENHYDRAMINE HCL 25 MG PO CAPS: 25.0000 mg | ORAL_CAPSULE | Freq: Four times a day (QID) | ORAL | Status: DC | PRN

## 2011-04-19 MED ORDER — IBUPROFEN 600 MG PO TABS
600.0000 mg | ORAL_TABLET | Freq: Four times a day (QID) | ORAL | Status: DC
  Administered 2011-04-19 – 2011-04-21 (×7): 600 mg via ORAL
  Filled 2011-04-19 (×7): qty 1

## 2011-04-19 MED ORDER — ONDANSETRON HCL 4 MG PO TABS: 4.0000 mg | ORAL_TABLET | ORAL | Status: DC | PRN

## 2011-04-19 MED ORDER — LANOLIN HYDROUS EX OINT: TOPICAL_OINTMENT | CUTANEOUS | Status: DC | PRN

## 2011-04-19 MED ORDER — DIBUCAINE 1 % RE OINT: 1.0000 "application " | TOPICAL_OINTMENT | RECTAL | Status: DC | PRN

## 2011-04-19 MED ORDER — LIDOCAINE HCL (PF) 1 % IJ SOLN
INTRAMUSCULAR | Status: DC | PRN
  Administered 2011-04-19 (×3): 4 mL

## 2011-04-19 MED ORDER — BENZOCAINE-MENTHOL 20-0.5 % EX AERO: 1.0000 "application " | INHALATION_SPRAY | CUTANEOUS | Status: DC | PRN

## 2011-04-19 MED ORDER — OXYCODONE-ACETAMINOPHEN 5-325 MG PO TABS: 1.0000 | ORAL_TABLET | ORAL | Status: DC | PRN

## 2011-04-19 MED ORDER — PHENYLEPHRINE 40 MCG/ML (10ML) SYRINGE FOR IV PUSH (FOR BLOOD PRESSURE SUPPORT): 80.0000 ug | PREFILLED_SYRINGE | INTRAVENOUS | Status: DC | PRN

## 2011-04-19 MED ORDER — WITCH HAZEL-GLYCERIN EX PADS: 1.0000 "application " | MEDICATED_PAD | CUTANEOUS | Status: DC | PRN

## 2011-04-19 MED ORDER — SENNOSIDES-DOCUSATE SODIUM 8.6-50 MG PO TABS
2.0000 | ORAL_TABLET | Freq: Every day | ORAL | Status: DC
  Administered 2011-04-19 – 2011-04-20 (×2): 2 via ORAL

## 2011-04-19 NOTE — Anesthesia Preprocedure Evaluation (Signed)
Anesthesia Evaluation  Patient identified by MRN, date of birth, ID band Patient awake    Reviewed: Allergy & Precautions, H&P , NPO status , Patient's Chart, lab work & pertinent test results, reviewed documented beta blocker date and time   History of Anesthesia Complications Negative for: history of anesthetic complications  Airway Mallampati: II TM Distance: >3 FB Neck ROM: full    Dental  (+) Teeth Intact   Pulmonary asthma (rare inhaler use) ,  breath sounds clear to auscultation        Cardiovascular hypertension (PIH), Rhythm:regular Rate:Normal     Neuro/Psych negative neurological ROS  negative psych ROS   GI/Hepatic negative GI ROS, Neg liver ROS,   Endo/Other  negative endocrine ROS  Renal/GU negative Renal ROS  negative genitourinary   Musculoskeletal   Abdominal   Peds  Hematology negative hematology ROS (+)   Anesthesia Other Findings   Reproductive/Obstetrics (+) Pregnancy                           Anesthesia Physical Anesthesia Plan  ASA: III  Anesthesia Plan: Epidural   Post-op Pain Management:    Induction:   Airway Management Planned:   Additional Equipment:   Intra-op Plan:   Post-operative Plan:   Informed Consent: I have reviewed the patients History and Physical, chart, labs and discussed the procedure including the risks, benefits and alternatives for the proposed anesthesia with the patient or authorized representative who has indicated his/her understanding and acceptance.     Plan Discussed with:   Anesthesia Plan Comments:         Anesthesia Quick Evaluation

## 2011-04-19 NOTE — Progress Notes (Signed)
Kiannah A Kopf is a 21 y.o. G2P1001 at [redacted]w[redacted]d  Subjective: S/p Cytotec x1. Patient comfortable; resting off/on. No concerns  Objective: BP 110/58  Pulse 76  Temp(Src) 98.1 F (36.7 C) (Oral)  Resp 20  Ht 5' 4.5" (1.638 m)  Wt 104.327 kg (230 lb)  BMI 38.87 kg/m2  SpO2 52%  LMP 07/19/2010     FHT:  FHR: 145 bpm, variability: moderate,  accelerations:  Present,  decelerations:  Absent UC:   irregular, every 3-6 minutes SVE:   Dilation: 4 Effacement (%): 80 Station: -1 Exam by:: Everrett Coombe RN  Labs: Lab Results  Component Value Date   WBC 10.2 04/18/2011   HGB 11.6* 04/18/2011   HCT 34.7* 04/18/2011   MCV 81.1 04/18/2011   PLT 223 04/18/2011    Assessment / Plan: Induction of labor due to chronic hypertension,  progressing well   Labor: Progressing normally Preeclampsia:  BP stable Fetal Wellbeing:  Category I Pain Control:  Epidural I/D:  n/a Anticipated MOD:  NSVD  Andreu Drudge 04/19/2011, 3:02 AM

## 2011-04-19 NOTE — Progress Notes (Signed)
Patient ID: Tina Buchanan, female   DOB: 09-01-1990, 21 y.o.   MRN: [id number] Tina Buchanan is a 21 y.o. G2P1001 at [redacted]w[redacted]d  Subjective: Pt becoming a little uncomfortable.   Objective: BP 139/80  Pulse 103  Temp(Src) 97.6 F (36.4 C) (Axillary)  Resp 20  Ht 5' 4.5" (1.638 m)  Wt 104.327 kg (230 lb)  BMI 38.87 kg/m2  SpO2 100%  LMP 07/19/2010     FHT:  FHR: 145 bpm, variability: moderate,  accelerations:  Present,  decelerations:  Absent UC:   irregular, every 3-6 minutes SVE:   10/100%/0, bulging membranes.   AROM --> meconium.  Labs: Lab Results  Component Value Date   WBC 10.2 04/18/2011   HGB 11.6* 04/18/2011   HCT 34.7* 04/18/2011   MCV 81.1 04/18/2011   PLT 223 04/18/2011    Assessment / Plan: Induction of labor due to chronic hypertension,  progressing well   Labor: Progressing normally and Complete, AROM, meconium.  Pt wants to labor down.  Preeclampsia:  BP stable Fetal Wellbeing:  Category I Pain Control:  Epidural I/D:  n/a Anticipated MOD:  NSVD  Tina Buchanan 04/19/2011, 8:53 AM

## 2011-04-19 NOTE — Anesthesia Procedure Notes (Signed)
Epidural Patient location during procedure: OB Start time: 04/19/2011 2:26 AM Reason for block: procedure for pain  Staffing Performed by: anesthesiologist   Preanesthetic Checklist Completed: patient identified, site marked, surgical consent, pre-op evaluation, timeout performed, IV checked, risks and benefits discussed and monitors and equipment checked  Epidural Patient position: sitting Prep: site prepped and draped and DuraPrep Patient monitoring: continuous pulse ox and blood pressure Approach: midline Injection technique: LOR air  Needle:  Needle type: Tuohy  Needle gauge: 17 G Needle length: 9 cm Catheter type: closed end flexible Catheter size: 19 Gauge Test dose: negative  Assessment Events: blood not aspirated, injection not painful, no injection resistance, negative IV test and no paresthesia  Additional Notes Discussed risk of headache, infection, bleeding, nerve injury and failed or incomplete block.  Patient voices understanding and wishes to proceed.  Discussed with [employer] on phone.

## 2011-04-19 NOTE — Procedures (Addendum)
Delivery Note At 11:10 AM a viable female was delivered via NSVD (Presentation: (LOA).  APGAR: 9/9.   Placenta status: spontaneous delivery, in tact , 3-vessel Cord:  with no complications.  Cord blood sent.   NICU team present on delivery due to heavy meconium, infant cried seconds after delivery.   Anesthesia:  Epidural  Lacerations: None Suture Repair:none Est. Blood Loss (mL): 600  Mom to postpartum.  Baby to nursery-stable.  CHAMBERLAIN,RACHEL 04/19/2011, 11:24 AM    I was present for above delivery and agree with note above. [facility]

## 2011-04-19 NOTE — Anesthesia Postprocedure Evaluation (Signed)
  Anesthesia Post-op Note  Patient: Tina Buchanan  Procedure(s) Performed: * No procedures listed *  Patient Location: Mother/Baby  Anesthesia Type: Epidural  Level of Consciousness: alert  and oriented  Airway and Oxygen Therapy: Patient Spontanous Breathing  Post-op Pain: mild  Post-op Assessment: Patient's Cardiovascular Status Stable and Respiratory Function Stable  Post-op Vital Signs: stable  Complications: No apparent anesthesia complications

## 2011-04-20 LAB — CBC
Hemoglobin: 9.5 g/dL — ABNORMAL LOW (ref 12.0–15.0)
RBC: 3.56 MIL/uL — ABNORMAL LOW (ref 3.87–5.11)
RDW: 15.3 % (ref 11.5–15.5)
WBC: 11.1 10*3/uL — ABNORMAL HIGH (ref 4.0–10.5)

## 2011-04-20 MED ORDER — BENZOCAINE-MENTHOL 20-0.5 % EX AERO
INHALATION_SPRAY | CUTANEOUS | Status: AC
  Filled 2011-04-20: qty 56

## 2011-04-20 NOTE — Progress Notes (Signed)
Language line for [employer] interpreter called used for teaching and asking for any questions or needs. Interpreter [phone] used. Questions answered in regards to pain, breastfeeding, bleeding, baby crying, procedure to be done of PKU.

## 2011-04-20 NOTE — Progress Notes (Signed)
UR Chart review completed.  

## 2011-04-20 NOTE — Progress Notes (Signed)
Post Partum Day #2 Subjective: up ad lib, voiding and tolerating PO  Objective: Blood pressure 110/76, pulse 93, temperature 97.9 F (36.6 C), temperature source Oral, resp. rate 20, height 5' 4.5" (1.638 m), weight 104.327 kg (230 lb), last menstrual period 07/19/2010, SpO2 100.00%, unknown if currently breastfeeding.  Physical Exam:  General: alert, cooperative and no distress Lochia: appropriate Uterine Fundus: firm DVT Evaluation: No evidence of DVT seen on physical exam.   Basename 04/20/11 0520 04/18/11 2015  HGB 9.5* 11.6*  HCT 29.0* 34.7*    Assessment/Plan: Plan for discharge tomorrow and Contraception Patient undecided.    LOS: 2 days   Aria Pickrell 04/20/2011, 7:35 AM

## 2011-04-20 NOTE — Plan of Care (Signed)
Problem: Consults Goal: Postpartum Patient Education (See Patient Education module for education specifics.)  Outcome: Completed/Met Date Met:  04/20/11 W/language line interpreter  Problem: Phase II Progression Outcomes Goal: Pain controlled on oral analgesia Outcome: Completed/Met Date Met:  04/20/11 W/language line interpreter

## 2011-04-21 ENCOUNTER — Encounter: Payer: Self-pay | Admitting: Family Medicine

## 2011-04-21 MED ORDER — IBUPROFEN 600 MG PO TABS: 600.0000 mg | ORAL_TABLET | Freq: Four times a day (QID) | ORAL | Status: AC

## 2011-04-21 NOTE — Discharge Instructions (Signed)
Vaginal Delivery Care After  Change your pad on each trip to the bathroom.   Wipe gently with toilet paper during your hospital stay. Always wipe from front to back. A spray bottle with warm tap water could also be used or a towelette if available.   Place your soiled pad and toilet paper in a bathroom wastebasket with a plastic bag liner.   During your hospital stay, save any clots. If you pass a clot while on the toilet, do not flush it. Also, if your vaginal flow seems excessive to you, notify nursing personnel.   The first time you get out of bed after delivery, wait for assistance from a nurse. Do not get up alone at any time if you feel weak or dizzy.   Bend and extend your ankles forcefully so that you feel the calves of your legs get hard. Do this 6 times every hour when you are in bed and awake.   Do not sit with one foot under you, dangle your legs over the edge of the bed, or maintain a position that hinders the circulation in your legs.   Many women experience after pains for 2 to 3 days after delivery. These after pains are mild uterine contractions. Ask the nurse for a pain medication if you need something for this. Sometimes breastfeeding stimulates after pains; if you find this to be true, ask for the medication  -  hour before the next feeding.   For you and your infant's protection, do not go beyond the door(s) of the obstetric unit. Do not carry your baby in your arms in the hallway. When taking your baby to and from your room, put your baby in the bassinet and push the bassinet.   Mothers may have their babies in their room as much as they desire.  Document Released: 01/16/2000 Document Revised: 01/07/2011 Document Reviewed: 12/16/2006 ExitCare Patient Information 2012 [employer]. 

## 2011-04-21 NOTE — Discharge Summary (Signed)
Obstetric Discharge Summary Reason for Admission: induction of labor Prenatal Procedures: NST and ultrasound Intrapartum Procedures: spontaneous vaginal delivery Postpartum Procedures: none Complications-Operative and Postpartum: none Hemoglobin  Date Value Range Status  04/20/2011 9.5* 12.0-15.0 (g/dL) Final     REPEATED TO VERIFY     DELTA CHECK NOTED     HCT  Date Value Range Status  04/20/2011 29.0* 36.0-46.0 (%) Final  11/11/2010 38   Final    Physical Exam:  General: alert, cooperative and no distress Lochia: appropriate Uterine Fundus: firm DVT Evaluation: No evidence of DVT seen on physical exam.  Discharge Diagnoses: Term Pregnancy-delivered  Discharge Information: Date: 04/21/2011 Activity: pelvic rest Diet: routine Medications: PNV and Ibuprofen Condition: stable Instructions: refer to practice specific booklet Discharge to: home Follow-up Information    Follow up with [facility]. Schedule an appointment as soon as possible for a visit in 6 weeks.         Newborn Data: Live born female  Birth Weight: 6 lb 4.3 oz (2843 g) APGAR: 9, 9  Home with mother.  Tina Buchanan 04/21/2011, 7:30 AM

## 2011-05-24 ENCOUNTER — Ambulatory Visit (INDEPENDENT_AMBULATORY_CARE_PROVIDER_SITE_OTHER): Payer: Medicaid Other | Admitting: Advanced Practice Midwife

## 2011-05-24 MED ORDER — DOCUSATE SODIUM 100 MG PO CAPS: 100.0000 mg | ORAL_CAPSULE | Freq: Two times a day (BID) | ORAL | Status: AC

## 2011-05-24 NOTE — Progress Notes (Unsigned)
  Subjective:     Tina Buchanan is a 21 y.o. female who presents for a postpartum visit. She is {1-10:13787} {time; units:18646} postpartum following a {delivery:12449}. I have fully reviewed the prenatal and intrapartum course. The delivery was at *** gestational weeks. Outcome: {delivery outcome:32078}. Anesthesia: {anesthesia types:812}. Postpartum course has been ***. Baby's course has been ***. Baby is feeding by {breast/bottle:69}. Bleeding {vag bleed:12292}. Bowel function is {normal:32111}. Bladder function is {normal:32111}. Patient {is/is not:9024} sexually active. Contraception method is none. Pt states she wants contraception, but husband does not want her on birth control. Undecided at this time. Postpartum depression screening: negative.  C/O constipation - drinking orange juice for relief, not helping  {Common ambulatory SmartLinks:19316}  Review of Systems {ros; complete:30496}   Objective:    BP 124/86  Pulse 85  Temp(Src) 98.5 F (36.9 C) (Oral)  Wt 211 lb 14.4 oz (96.117 kg)  Breastfeeding? Yes  General:  {gen appearance:16600}   Breasts:  {breast exam:1202::"inspection negative, no nipple discharge or bleeding, no masses or nodularity palpable"}  Lungs: {lung exam:16931}  Heart:  {heart exam:5510}  Abdomen: {abdomen exam:16834}   Vulva:  {labia exam:12198}  Vagina: {vagina exam:12200}  Cervix:  {cervix exam:14595}  Corpus: {uterus exam:12215}  Adnexa:  {adnexa exam:12223}  Rectal Exam: {rectal/vaginal exam:12274}        Assessment:    *** postpartum exam. Pap smear {done:10129} at today's visit.   Plan:    1. Contraception: {method:5051} 2. *** 3. Follow up in: {1-10:13787} {time; units:19136} or as needed.

## 2011-05-24 NOTE — Patient Instructions (Signed)
Contraception Choices Birth control (contraception) can stop pregnancy from happening. Different types of birth control work in different ways. Some can:  Make the mucus in the cervix thick. This makes it hard for sperm to get into the uterus.   Thin the lining of the uterus. This makes it hard for an egg to attach to the wall of the uterus.   Stop the ovaries from releasing an egg.   Block the sperm from reaching the egg.  Certain types of surgery can stop pregnancy from happening. For women, the sugery closes the fallopian tubes (tubal ligation). For men, the surgery stops sperm from releasing during sex (vasectomy). HORMONAL BIRTH CONTROL Hormonal birth control stops pregnancy by putting hormones into your body. Types of birth control include:  A small tube put under the skin of the upper arm (implant). The tube can stay in place for 3 years.   Shots given every 3 months.   Pills taken every day or once after sex (intercourse).   Patches that are changed once a week.   A ring put into the vagina (vaginal ring). The ring is left in place for 3 weeks and removed for 1 week. Then, a new ring is put in the vagina.  BARRIER BIRTH CONTROL  Barrier birth control blocks sperm from reaching the egg. Types of birth control include:   A thin covering worn on the penis (female condom) during sex.   A soft, loose covering put into the vagina (female condom) before sex.   A rubber bowl that sits over the cervix (diaphragm). The bowl must be made for you. The bowl is put into the vagina before sex. The bowl is left in place for 6 to 8 hours after sex.   A small, soft cup that fits over the cervix (cervical cap). The cup must be made for you. The cup can be left in place for 48 hours after sex.   A sponge that is put into the vagina before sex.   A chemical that kills or blocks sperm from getting into the cervix and uterus (spermicide). The chemical may be a cream, jelly, foam, or pill.    INTRAUTERINE (IUD) BIRTH CONTROL  IUD birth control is a small, T-shaped piece of plastic. The plastic is put inside the uterus. There are 2 types of IUD:  Copper IUD. The IUD is covered in copper wire. The copper makes a fluid that kills sperm. It can stay in place for 10 years.   Hormone IUD. The hormone stops pregnancy from happening. It can stay in place for 5 years.  NATURAL FAMILY PLANNING BIRTH CONTROL  Natural family planning means not having sex or using barrier birth control when the woman is fertile. A woman can:  Use a calendar to keep track of when she is fertile.   Use a thermometer to measure her body temperature.  Protect yourself against sexual diseases no matter what type of birth control you use. Talk to your doctor about which type of birth control is best for you. Document Released: 11/15/2008 Document Revised: 01/07/2011 Document Reviewed: 05/27/2010 ExitCare Patient Information 2012 [employer]. 

## 2011-06-02 ENCOUNTER — Encounter (HOSPITAL_COMMUNITY): Payer: Self-pay

## 2011-06-02 ENCOUNTER — Inpatient Hospital Stay (HOSPITAL_COMMUNITY)
Admission: AD | Admit: 2011-06-02 | Discharge: 2011-06-02 | Disposition: A | Discharge status: Home / Self Care | Payer: Medicaid Other | Source: Ambulatory Visit | Attending: Obstetrics and Gynecology | Admitting: Obstetrics and Gynecology

## 2011-06-02 DIAGNOSIS — N949 Unspecified condition associated with female genital organs and menstrual cycle: Secondary | ICD-10-CM | POA: Insufficient documentation

## 2011-06-02 DIAGNOSIS — O2210 Genital varices in pregnancy, unspecified trimester: Secondary | ICD-10-CM | POA: Insufficient documentation

## 2011-06-02 LAB — URINALYSIS, ROUTINE W REFLEX MICROSCOPIC
Glucose, UA: NEGATIVE mg/dL
Ketones, ur: NEGATIVE mg/dL
pH: 6 (ref 5.0–8.0)

## 2011-06-02 LAB — URINE MICROSCOPIC-ADD ON

## 2011-06-02 NOTE — MAU Note (Signed)
Patient states that she had a normal vaginal delivery 04/19/11 with laceration repair. She states that she has been having vaginal pain since but didn't mention it at her 6wks pp visit because she thought it was normal. She is c/o "vulva pain when she urinates". decribes it as "it feels like a piece of meat that wants to come out". She speaks Arabic. Denies vaginal bleeding or discharge. She states that intercourse was slightly painful

## 2011-06-02 NOTE — MAU Provider Note (Signed)
Tina A Munsor20 y.o.G2P2002. Chief Complaint  Patient presents with  . Vaginal Pain     None     SUBJECTIVE  HPI: Pt is 6 weeks PP NSVD, intact perineum who presents to MAU reporting feeling pressure near her urethra and anterior vagina and feeling as a a "piece of meat needs to come out". The pressure is worse w/ prolonged standing. She denies dysuria, hematuria, frequency, fever or chills. She was seen for her routine PP visit 05/24/11 and was experiencing the problems, but did not mention them.  Past Medical History  Diagnosis Date  . Chronic headaches   . Asthma   . Hypertension    Past Surgical History  Procedure Date  . Tonsillectomy 2007   History   Social History  . Marital Status: Married    Spouse Name: N/A    Number of Children: N/A  . Years of Education: N/A   Occupational History  . Not on file.   Social History Main Topics  . Smoking status: Never Smoker   . Smokeless tobacco: Never Used  . Alcohol Use: No  . Drug Use: No  . Sexually Active: Yes   Other Topics Concern  . Not on file   Social History Narrative  . No narrative on file   No current facility-administered medications on file prior to encounter.   Current Outpatient Prescriptions on File Prior to Encounter  Medication Sig Dispense Refill  . albuterol (PROVENTIL HFA;VENTOLIN HFA) 108 (90 BASE) MCG/ACT inhaler Inhale 2 puffs into the lungs every 6 (six) hours as needed.  1 Inhaler  3  . docusate sodium (COLACE) 100 MG capsule Take 1 capsule (100 mg total) by mouth 2 (two) times daily.  30 capsule  2   No Known Allergies  ROS: Pertinent items in HPI  OBJECTIVE Blood pressure 125/70, pulse 89, temperature 98.4 F (36.9 C), temperature source Oral, resp. rate 18, height 5\' 4"  (1.626 m), weight 95.766 kg (211 lb 2 oz), currently breastfeeding.  GENERAL: Well-developed, well-nourished female in no acute distress.  HEENT: Normocephalic HEART: normal rate RESP: normal effort ABDOMEN:  Soft, nontender EXTREMITIES: Nontender, no edema NEURO: Alert and oriented SPECULUM EXAM: well-healed remnant of lacerated labia minora noted near urethral meatus. Pt states that is not what she was feeling. Pointed inside of vagina to anterior wall. Possible varicosity seen/palpated. Physiologic discharge, no blood noted, cervix clean  LAB RESULTS Recent Results (from the past 168 hour(s))  URINALYSIS, ROUTINE W REFLEX MICROSCOPIC   Collection Time   06/02/11  5:08 PM      Component Value Range   Color, Urine YELLOW  YELLOW    APPearance HAZY (*) CLEAR    Specific Gravity, Urine 1.025  1.005 - 1.030    pH 6.0  5.0 - 8.0    Glucose, UA NEGATIVE  NEGATIVE (mg/dL)   Hgb urine dipstick TRACE (*) NEGATIVE    Bilirubin Urine NEGATIVE  NEGATIVE    Ketones, ur NEGATIVE  NEGATIVE (mg/dL)   Protein, ur NEGATIVE  NEGATIVE (mg/dL)   Urobilinogen, UA 0.2  0.0 - 1.0 (mg/dL)   Nitrite NEGATIVE  NEGATIVE    Leukocytes, UA SMALL (*) NEGATIVE   URINE MICROSCOPIC-ADD ON   Collection Time   06/02/11  5:08 PM      Component Value Range   Squamous Epithelial / LPF FEW (*) RARE    WBC, UA 21-50  <3 (WBC/hpf)   Bacteria, UA RARE  RARE     IMAGING NA  ASSESSMENT  1. 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 Avoid prolonged standing Reassurance given F/U PRN at [facility], [place] 06/02/2011 5:54 PM

## 2011-06-02 NOTE — Discharge Instructions (Signed)
You  Have been diagnosed with varicose veins of the vaginal wall. They are not dangerous. Avoid standing for a long time if it causes pain. Supportive underwear or a "V" style maternity support belt may help. You can order these online.

## 2011-06-05 LAB — URINE CULTURE: Colony Count: >=100000

## 2011-06-07 ENCOUNTER — Telehealth: Payer: Self-pay | Admitting: Advanced Practice Midwife

## 2011-06-07 MED ORDER — AMPICILLIN 500 MG PO CAPS: 500.0000 mg | ORAL_CAPSULE | Freq: Four times a day (QID) | ORAL | Status: AC

## 2011-06-07 NOTE — MAU Provider Note (Signed)
Agree with above note.  Tina Buchanan 06/07/2011 2:07 PM

## 2011-06-07 NOTE — Telephone Encounter (Signed)
Certified letter mailed to patient.  ° °

## 2011-06-07 NOTE — Telephone Encounter (Signed)
Seen in MAU 06/02/11. Urine culture Pos enterococcus. E-Rx Ampicillin (due to to breastfeeding and sensitives). No answer.

## 2011-06-10 NOTE — Progress Notes (Incomplete)
NST reactive 04/12/11

## 2011-06-18 NOTE — Telephone Encounter (Signed)
Certified letter returned 06/14/11 "return to sender, no such street" unable to forward.

## 2013-01-22 ENCOUNTER — Other Ambulatory Visit (HOSPITAL_COMMUNITY): Payer: Self-pay | Admitting: Physician Assistant

## 2013-01-22 DIAGNOSIS — Z3682 Encounter for antenatal screening for nuchal translucency: Secondary | ICD-10-CM

## 2013-01-22 LAB — OB RESULTS CONSOLE RUBELLA ANTIBODY, IGM: Rubella: IMMUNE

## 2013-01-22 LAB — OB RESULTS CONSOLE HIV ANTIBODY (ROUTINE TESTING): HIV: NONREACTIVE

## 2013-01-22 LAB — OB RESULTS CONSOLE ABO/RH: RH Type: POSITIVE

## 2013-01-22 LAB — OB RESULTS CONSOLE GC/CHLAMYDIA
Chlamydia: NEGATIVE
GC PROBE AMP, GENITAL: NEGATIVE

## 2013-01-22 LAB — OB RESULTS CONSOLE ANTIBODY SCREEN: ANTIBODY SCREEN: NEGATIVE

## 2013-01-22 LAB — OB RESULTS CONSOLE RPR: RPR: NONREACTIVE

## 2013-01-22 LAB — OB RESULTS CONSOLE VARICELLA ZOSTER ANTIBODY, IGG: VARICELLA IGG: NON-IMMUNE/NOT IMMUNE

## 2013-01-22 LAB — OB RESULTS CONSOLE HEPATITIS B SURFACE ANTIGEN: HEP B S AG: NEGATIVE

## 2013-02-01 NOTE — L&D Delivery Note (Signed)
Delivery Note At 0433  a healthy female was delivered via  spontaneous vaginal delivery(Presentation vertex: ;  ).  Cord blood obtained  Anesthesia:  None Episiotomy: None Lacerations: None Suture Repair: N/A Est. Blood Loss (mL): 100  Patient complete and pushing on arrival. Within minutes, the head was delivered over an intact perineum. There was a compound presentation with a left hand that was easily extracted. Shoulders and body extracted from vagina without difficulty. Cord was clamped and cut. Very minimal vaginal bleeding during delivery. Placenta was delivered and found to be intact. Cord was 3 vessel. Uterus was massaged after delivery. No lacerations were found on examination of the vagina or perineum. Mom and baby were left in good condition and transferred to mother baby.   Mom to postpartum.  Baby to Nursery.   Delivery attended by Eino FarberWalidah Larena SoxKarim Lawanna Cecere A 08/22/2013, 4:54 AM

## 2013-02-01 NOTE — L&D Delivery Note (Signed)
I was present for delivery and agree with note above. Maclane Holloran N Karim, CNM  

## 2013-02-06 ENCOUNTER — Ambulatory Visit (HOSPITAL_COMMUNITY): Payer: Medicaid Other

## 2013-02-06 ENCOUNTER — Other Ambulatory Visit (HOSPITAL_COMMUNITY): Payer: Medicaid Other

## 2013-02-09 ENCOUNTER — Encounter (HOSPITAL_COMMUNITY): Payer: Self-pay

## 2013-02-09 ENCOUNTER — Ambulatory Visit (HOSPITAL_COMMUNITY)
Admission: RE | Admit: 2013-02-09 | Discharge: 2013-02-09 | Disposition: A | Discharge status: Home / Self Care | Payer: Medicaid Other | Source: Ambulatory Visit | Attending: Physician Assistant | Admitting: Physician Assistant

## 2013-02-09 ENCOUNTER — Ambulatory Visit (HOSPITAL_COMMUNITY): Admission: RE | Admit: 2013-02-09 | Payer: Medicaid Other | Source: Ambulatory Visit

## 2013-02-09 DIAGNOSIS — O3510X0 Maternal care for (suspected) chromosomal abnormality in fetus, unspecified, not applicable or unspecified: Secondary | ICD-10-CM | POA: Insufficient documentation

## 2013-02-09 DIAGNOSIS — Z3689 Encounter for other specified antenatal screening: Secondary | ICD-10-CM | POA: Insufficient documentation

## 2013-02-09 DIAGNOSIS — Z3682 Encounter for antenatal screening for nuchal translucency: Secondary | ICD-10-CM

## 2013-02-09 DIAGNOSIS — O0990 Supervision of high risk pregnancy, unspecified, unspecified trimester: Secondary | ICD-10-CM | POA: Insufficient documentation

## 2013-02-09 DIAGNOSIS — O351XX0 Maternal care for (suspected) chromosomal abnormality in fetus, not applicable or unspecified: Secondary | ICD-10-CM | POA: Insufficient documentation

## 2013-02-16 ENCOUNTER — Other Ambulatory Visit (HOSPITAL_COMMUNITY): Payer: Self-pay | Admitting: Family

## 2013-02-16 ENCOUNTER — Other Ambulatory Visit: Payer: Self-pay

## 2013-02-16 DIAGNOSIS — Z3689 Encounter for other specified antenatal screening: Secondary | ICD-10-CM

## 2013-03-23 ENCOUNTER — Encounter (HOSPITAL_COMMUNITY): Payer: Self-pay

## 2013-03-23 ENCOUNTER — Ambulatory Visit (HOSPITAL_COMMUNITY): Payer: Medicaid Other

## 2013-03-23 ENCOUNTER — Other Ambulatory Visit: Payer: Self-pay

## 2013-03-23 ENCOUNTER — Ambulatory Visit (HOSPITAL_COMMUNITY)
Admission: RE | Admit: 2013-03-23 | Discharge: 2013-03-23 | Disposition: A | Discharge status: Home / Self Care | Payer: Medicaid Other | Source: Ambulatory Visit | Attending: Family | Admitting: Family

## 2013-03-23 ENCOUNTER — Ambulatory Visit (HOSPITAL_COMMUNITY)
Admission: RE | Admit: 2013-03-23 | Discharge: 2013-03-23 | Disposition: A | Discharge status: Home / Self Care | Payer: Medicaid Other | Source: Ambulatory Visit | Attending: Physician Assistant | Admitting: Physician Assistant

## 2013-03-23 DIAGNOSIS — Z363 Encounter for antenatal screening for malformations: Secondary | ICD-10-CM | POA: Insufficient documentation

## 2013-03-23 DIAGNOSIS — O10019 Pre-existing essential hypertension complicating pregnancy, unspecified trimester: Secondary | ICD-10-CM | POA: Insufficient documentation

## 2013-03-23 DIAGNOSIS — O0990 Supervision of high risk pregnancy, unspecified, unspecified trimester: Secondary | ICD-10-CM | POA: Insufficient documentation

## 2013-03-23 DIAGNOSIS — Z3689 Encounter for other specified antenatal screening: Secondary | ICD-10-CM

## 2013-03-23 DIAGNOSIS — O358XX0 Maternal care for other (suspected) fetal abnormality and damage, not applicable or unspecified: Secondary | ICD-10-CM | POA: Insufficient documentation

## 2013-03-23 DIAGNOSIS — Z1389 Encounter for screening for other disorder: Secondary | ICD-10-CM | POA: Insufficient documentation

## 2013-03-23 NOTE — Progress Notes (Signed)
MATERNAL FETAL MEDICINE CONSULT  Patient Name: Tina Buchanan Medical Record Number:  [id number] Date of Birth: Dec 06, 1990 Requesting Physician Name:  Tina Belfast, FNP Date of Service: 03/23/2013  Chief Complaint Echogenic intracardiac focus & echogenic intraabdominal focus  History of Present Illness Tina Buchanan was seen today secondary to echogenic intracardiac focus & echogenic intraabdominal focus at the request of Tina Belfast, FNP.  The patient is a 23 y.o. G3P2002,at [redacted]w[redacted]d with an EDD of 08/16/2013, by Last Menstrual Period dating method.  She was seen today in the [facility] for a routine fetal anatomic survey.  She was seen here once before for a first trimester screen.  The NT was not able to be adequately measured on that visit.  After being present with additional testing options, Tina Buchanan opted to forgo all screening.  On today's ultrasound an echogenic intracardiac focus & echogenic intraabdominal focus were seen.  All anatomy was not adequately visualized, but no additional anomalies or soft markers of aneuploidy were seen.  Review of Systems Pertinent items are noted in HPI.  Patient History OB History  Gravida Para Term Preterm AB SAB TAB Ectopic Multiple Living  3 2 2  0 0 0 0 0 0 2    # Outcome Date GA Lbr Len/2nd Weight Sex Delivery Anes PTL Lv  3 CUR           2 TRM 04/19/11 [redacted]w[redacted]d 08:21 / 02:20 6 lb 4.3 oz (2.843 kg) M SVD EPI  Y     Comments: caput  1 TRM 12/2008 [redacted]w[redacted]d  5 lb 9 oz (2.523 kg) F SVD EPI       Comments: Born WHOG, +GBSurine, no complications      Past Medical History  Diagnosis Date  . Chronic headaches   . Asthma   . Hypertension     Past Surgical History  Procedure Laterality Date  . Tonsillectomy  2007    History   Social History  . Marital Status: Married    Spouse Name: N/A    Number of Children: N/A  . Years of Education: N/A   Social History Main Topics  . Smoking status: Never Smoker   . Smokeless tobacco: Never Used  .  Alcohol Use: No  . Drug Use: No  . Sexual Activity: Yes   Other Topics Concern  . Not on file   Social History Narrative  . No narrative on file   Tina Buchanan is from [place].  She most recently visited there in April of 2014.  Family History  Problem Relation Age of Onset  . Heart disease Mother   . Hypertension Mother   . Diabetes Mother   . Heart disease Father   . Diabetes Brother    In addition, the patient has no family history of mental retardation, birth defects, or genetic diseases.  Assessment and Recommendations 1.  Echogenic intracardiac focus & echogenic intraabdominal focus.  The combination of these two findings does not immediately suggest a common underlying pathology.  Likely these are both benign findings; however, there are certain risks that should be considered.  In isolation the echogenic intracardiac focus is a soft marker for aneuploidy in general and Downs Syndrome in particular.  An echogenic intraabdominal focus is associated with congenital infection or cystic fibrosis.  I discussed testing options for all of these issues.  Tina Buchanan would prefer to forgo invasive testing via amniocentesis, preferring to proceed with cell-free fetal DNA testing, maternal TORCH titers, and  cystic fibrosis carrier testing.  We will also repeat an ultrasound in 4 weeks to further assess the intraabdominal focus in particular.  The entire counseling session was conducted with the help of an Arabic interpreter.  I spent 30 minutes with Tina Buchanan today of which 50% was face-to-face counseling.  [facility].  Please do not hesitate to contact us with questions.   Rema FendtNITSCHE,Ramere Downs, MD

## 2013-03-24 LAB — CMV ANTIBODY, IGG (EIA)

## 2013-03-24 LAB — TOXOPLASMA GONDII ANTIBODY, IGG

## 2013-03-26 LAB — HSV 1 ANTIBODY, IGG: HSV 1 Glycoprotein G Ab, IgG: 8.43 IV — ABNORMAL HIGH

## 2013-03-26 LAB — HSV 2 ANTIBODY, IGG: HSV 2 Glycoprotein G Ab, IgG: 0.14 IV

## 2013-03-27 ENCOUNTER — Ambulatory Visit (HOSPITAL_COMMUNITY): Payer: Medicaid Other

## 2013-04-02 LAB — TORCH-IGM(TOXO/ RUB/ CMV/ HSV) W TITER
CMV IgM: 0.2
HSV1IGM: NEGATIVE
HSV2IGM: NEGATIVE
RPR Screen: NONREACTIVE
Rubella IgM Index: <0.9 (ref <0.90)
TOXOPLASMA IGM: POSITIVE

## 2013-04-05 ENCOUNTER — Telehealth (HOSPITAL_COMMUNITY): Payer: Self-pay | Admitting: Maternal and Fetal Medicine

## 2013-04-05 ENCOUNTER — Telehealth (HOSPITAL_COMMUNITY): Payer: Self-pay | Admitting: *Deleted

## 2013-04-05 NOTE — Telephone Encounter (Signed)
Attempted to call Ms. Tina Buchanan regarding her cell free DNA/non-invasive prenatal screening (NIPS) results.  There was no result.  The laboratory processing could not yield results, therefore submission of a repeat specimen is required for testing.  This situation occurs in a small number of samples and may be due to inadequate DNA sequencing, insufficient yield of DNA or sample impurity.  There was no answer and voicemail was not set up.

## 2013-04-05 NOTE — Telephone Encounter (Signed)
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  Used pacific interpreter [phone].  No answer and no voicemail set up on her phone.  Attempted to call spouse's phone as well.  No answer and no voicemail set up on his phone either.  Will try to call back later.

## 2013-04-06 ENCOUNTER — Telehealth (HOSPITAL_COMMUNITY): Payer: Self-pay | Admitting: *Deleted

## 2013-04-06 NOTE — Telephone Encounter (Signed)
Attempted to call patient with her lab results again today.  Used interpreter # [id number].  Tried both patient's and her husband's phone number.  No answer on either number and no voicemail option is available.  Will try again Monday.

## 2013-04-09 ENCOUNTER — Telehealth (HOSPITAL_COMMUNITY): Payer: Self-pay | Admitting: *Deleted

## 2013-04-09 NOTE — Telephone Encounter (Signed)
Called patient using pacific interpreter language line.  Voicemail left for patient to call Maternal Fetal Care.

## 2013-04-19 ENCOUNTER — Other Ambulatory Visit (HOSPITAL_COMMUNITY): Payer: Self-pay | Admitting: Family

## 2013-04-19 DIAGNOSIS — O98619 Protozoal diseases complicating pregnancy, unspecified trimester: Principal | ICD-10-CM

## 2013-04-19 DIAGNOSIS — B589 Toxoplasmosis, unspecified: Secondary | ICD-10-CM

## 2013-04-20 ENCOUNTER — Telehealth (HOSPITAL_COMMUNITY): Payer: Self-pay | Admitting: *Deleted

## 2013-04-20 ENCOUNTER — Ambulatory Visit (HOSPITAL_COMMUNITY): Admission: RE | Admit: 2013-04-20 | Payer: Medicaid Other | Source: Ambulatory Visit

## 2013-04-20 NOTE — Telephone Encounter (Signed)
I called Linus OrnMary Shaw, RN at [facility] today after Mrs. Penniman did not show up for her appointment.  Explained that I have tried to call the patient several times to inform her of abnormal lab results and have been unable to reach her.  Corrie DandyMary called the patient and spoke with her husband and explained the need to contact MFM and reschedule her appointment.  She assured me that he will be in touch to reschedule.

## 2013-04-24 ENCOUNTER — Ambulatory Visit (HOSPITAL_COMMUNITY)
Admission: RE | Admit: 2013-04-24 | Discharge: 2013-04-24 | Disposition: A | Discharge status: Home / Self Care | Payer: Medicaid Other | Source: Ambulatory Visit

## 2013-04-24 ENCOUNTER — Ambulatory Visit (HOSPITAL_COMMUNITY)
Admission: RE | Admit: 2013-04-24 | Discharge: 2013-04-24 | Disposition: A | Discharge status: Home / Self Care | Payer: Medicaid Other | Source: Ambulatory Visit | Attending: Family | Admitting: Family

## 2013-04-24 ENCOUNTER — Telehealth (HOSPITAL_COMMUNITY): Payer: Self-pay | Admitting: *Deleted

## 2013-04-24 ENCOUNTER — Other Ambulatory Visit: Payer: Self-pay

## 2013-04-24 DIAGNOSIS — O10019 Pre-existing essential hypertension complicating pregnancy, unspecified trimester: Secondary | ICD-10-CM | POA: Insufficient documentation

## 2013-04-24 DIAGNOSIS — B589 Toxoplasmosis, unspecified: Secondary | ICD-10-CM

## 2013-04-24 DIAGNOSIS — O98619 Protozoal diseases complicating pregnancy, unspecified trimester: Secondary | ICD-10-CM

## 2013-04-24 DIAGNOSIS — O0990 Supervision of high risk pregnancy, unspecified, unspecified trimester: Secondary | ICD-10-CM | POA: Insufficient documentation

## 2013-04-24 NOTE — ED Notes (Signed)
pts contact information updated in system.  No voicemailbox for either phone.  Interpreter Nuha here at appt today gave her phone number [phone] to call if unable to reach pt.

## 2013-05-04 ENCOUNTER — Telehealth (HOSPITAL_COMMUNITY): Payer: Self-pay | Admitting: *Deleted

## 2013-05-04 NOTE — Telephone Encounter (Signed)
Called patient with lab results of Toxo avididty.  No answer.  Left message to please call back.

## 2013-05-09 ENCOUNTER — Other Ambulatory Visit (HOSPITAL_COMMUNITY): Payer: Self-pay | Admitting: Physician Assistant

## 2013-05-09 DIAGNOSIS — O10019 Pre-existing essential hypertension complicating pregnancy, unspecified trimester: Secondary | ICD-10-CM

## 2013-05-09 DIAGNOSIS — O0990 Supervision of high risk pregnancy, unspecified, unspecified trimester: Secondary | ICD-10-CM

## 2013-05-15 ENCOUNTER — Ambulatory Visit (HOSPITAL_COMMUNITY)
Admission: RE | Admit: 2013-05-15 | Discharge: 2013-05-15 | Disposition: A | Discharge status: Home / Self Care | Payer: Medicaid Other | Source: Ambulatory Visit | Attending: Physician Assistant | Admitting: Physician Assistant

## 2013-05-15 ENCOUNTER — Other Ambulatory Visit (HOSPITAL_COMMUNITY): Payer: Self-pay | Admitting: Physician Assistant

## 2013-05-15 DIAGNOSIS — O0990 Supervision of high risk pregnancy, unspecified, unspecified trimester: Secondary | ICD-10-CM

## 2013-05-15 DIAGNOSIS — O10019 Pre-existing essential hypertension complicating pregnancy, unspecified trimester: Secondary | ICD-10-CM

## 2013-06-07 ENCOUNTER — Other Ambulatory Visit (HOSPITAL_COMMUNITY): Payer: Self-pay | Admitting: Physician Assistant

## 2013-06-07 DIAGNOSIS — O0990 Supervision of high risk pregnancy, unspecified, unspecified trimester: Secondary | ICD-10-CM

## 2013-06-07 DIAGNOSIS — O10019 Pre-existing essential hypertension complicating pregnancy, unspecified trimester: Secondary | ICD-10-CM

## 2013-06-12 ENCOUNTER — Ambulatory Visit (HOSPITAL_COMMUNITY): Payer: Medicaid Other

## 2013-06-14 ENCOUNTER — Ambulatory Visit (HOSPITAL_COMMUNITY)
Admission: RE | Admit: 2013-06-14 | Discharge: 2013-06-14 | Disposition: A | Discharge status: Home / Self Care | Payer: Medicaid Other | Source: Ambulatory Visit | Attending: Physician Assistant | Admitting: Physician Assistant

## 2013-06-14 DIAGNOSIS — O0990 Supervision of high risk pregnancy, unspecified, unspecified trimester: Secondary | ICD-10-CM | POA: Insufficient documentation

## 2013-06-14 DIAGNOSIS — O10019 Pre-existing essential hypertension complicating pregnancy, unspecified trimester: Secondary | ICD-10-CM | POA: Insufficient documentation

## 2013-07-14 ENCOUNTER — Inpatient Hospital Stay (HOSPITAL_COMMUNITY): Payer: Medicaid Other

## 2013-07-14 ENCOUNTER — Inpatient Hospital Stay (HOSPITAL_COMMUNITY)
Admission: AD | Admit: 2013-07-14 | Discharge: 2013-07-14 | Disposition: A | Discharge status: Home / Self Care | Payer: Medicaid Other | Source: Ambulatory Visit | Attending: Obstetrics and Gynecology | Admitting: Obstetrics and Gynecology

## 2013-07-14 ENCOUNTER — Encounter (HOSPITAL_COMMUNITY): Payer: Self-pay | Admitting: *Deleted

## 2013-07-14 DIAGNOSIS — J45901 Unspecified asthma with (acute) exacerbation: Secondary | ICD-10-CM | POA: Diagnosis present

## 2013-07-14 DIAGNOSIS — O99891 Other specified diseases and conditions complicating pregnancy: Secondary | ICD-10-CM | POA: Insufficient documentation

## 2013-07-14 DIAGNOSIS — O9989 Other specified diseases and conditions complicating pregnancy, childbirth and the puerperium: Principal | ICD-10-CM

## 2013-07-14 LAB — CBC
HEMATOCRIT: 32.7 % — AB (ref 36.0–46.0)
Hemoglobin: 11.1 g/dL — ABNORMAL LOW (ref 12.0–15.0)
MCH: 26.4 pg (ref 26.0–34.0)
MCHC: 33.9 g/dL (ref 30.0–36.0)
MCV: 77.9 fL — AB (ref 78.0–100.0)
PLATELETS: 223 10*3/uL (ref 150–400)
RBC: 4.2 MIL/uL (ref 3.87–5.11)
RDW: 14 % (ref 11.5–15.5)
WBC: 9.8 10*3/uL (ref 4.0–10.5)

## 2013-07-14 MED ORDER — PREDNISONE 20 MG PO TABS: 60.0000 mg | ORAL_TABLET | Freq: Every day | ORAL | Status: DC

## 2013-07-14 MED ORDER — PREDNISONE 50 MG PO TABS
60.0000 mg | ORAL_TABLET | ORAL | Status: AC
  Administered 2013-07-14: 60 mg via ORAL
  Filled 2013-07-14: qty 1

## 2013-07-14 MED ORDER — BUDESONIDE 90 MCG/ACT IN AEPB: 1.0000 | INHALATION_SPRAY | Freq: Two times a day (BID) | RESPIRATORY_TRACT | Status: AC

## 2013-07-14 MED ORDER — IPRATROPIUM-ALBUTEROL 0.5-2.5 (3) MG/3ML IN SOLN
3.0000 mL | RESPIRATORY_TRACT | Status: AC
  Administered 2013-07-14 (×3): 3 mL via RESPIRATORY_TRACT
  Filled 2013-07-14 (×3): qty 3

## 2013-07-14 MED ORDER — PREDNISONE 50 MG PO TABS
60.0000 mg | ORAL_TABLET | Freq: Every day | ORAL | Status: DC
  Filled 2013-07-14: qty 1

## 2013-07-14 NOTE — MAU Note (Signed)
Audible wheezing, O2 sat 98% on RA.

## 2013-07-14 NOTE — MAU Note (Signed)
Pt C/O SOB, hx of asthma, has been worse for last 2 days, using her inhaler as needed.  Last used it one week ago, is out of her inhaler.  Denies uc's, bleeding or LOF.

## 2013-07-14 NOTE — MAU Provider Note (Signed)
First Provider Initiated Contact with Patient 07/14/13 1850      Chief Complaint:  No chief complaint on file.   Tina Buchanan is  23 y.o. [id number] at 436w2d presents complaining of asthma exacerbation. Pt has not been using her inhaler for the last week. Pt feels dyspnic, has pain on her left shoulder. But otherwise has no complaints.   Pt has used her children's without improvement and has not filled her Rx for her inhaler from our office on 6/10. Pt states that she has good fetal movement, no LOF, no VB, no ctx at this time.    Obstetrical/Gynecological History: OB History   Grav Para Term Preterm Abortions TAB SAB Ect Mult Living   3 2 2  0 0 0 0 0 0 2     Past Medical History: Past Medical History  Diagnosis Date  . Chronic headaches   . Asthma   . Hypertension     Past Surgical History: Past Surgical History  Procedure Laterality Date  . Tonsillectomy  2007    Family History: Family History  Problem Relation Age of Onset  . Heart disease Mother   . Hypertension Mother   . Diabetes Mother   . Heart disease Father   . Diabetes Brother     Social History: History  Substance Use Topics  . Smoking status: Never Smoker   . Smokeless tobacco: Never Used  . Alcohol Use: No    Allergies: No Known Allergies  Meds:  Prescriptions prior to admission  Medication Sig Dispense Refill  . albuterol (PROVENTIL HFA;VENTOLIN HFA) 108 (90 BASE) MCG/ACT inhaler Inhale 2 puffs into the lungs every 6 (six) hours as needed.  1 Inhaler  3    Review of Systems -   Review of Systems  Constitutional: Negative for fever, chills, weight loss, malaise/fatigue and diaphoresis.  HENT: Negative for hearing loss, ear pain, nosebleeds, congestion, sore throat, neck pain, tinnitus and ear discharge.   Eyes: Negative for blurred vision, double vision, photophobia, pain, discharge and redness.  Respiratory: Negative for cough, hemoptysis, sputum production, shortness of breath, wheezing  and stridor.   Cardiovascular: Negative for chest pain, palpitations, orthopnea,  leg swelling  Gastrointestinal: Negative for abdominal pain heartburn, nausea, vomiting, diarrhea, constipation, blood in stool Genitourinary: Negative for dysuria, urgency, frequency, hematuria and flank pain.  Musculoskeletal: Negative for myalgias, back pain, joint pain and falls.  Skin: Negative for itching and rash.  Neurological: Negative for dizziness, tingling, tremors, sensory change, speech change, focal weakness, seizures, loss of consciousness, weakness and headaches.  Endo/Heme/Allergies: Negative for environmental allergies and polydipsia. Does not bruise/bleed easily.  Psychiatric/Behavioral: Negative for depression, suicidal ideas, hallucinations, memory loss and substance abuse. The patient is not nervous/anxious and does not have insomnia.      Physical Exam  Blood pressure 128/87, pulse 115, temperature 98.3 F (36.8 C), temperature source Oral, resp. rate 22, last menstrual period 11/09/2012, SpO2 97.00%, currently breastfeeding. GENERAL: Well-developed, well-nourished female in mild distress.  LUNGS: difuse wheeze bilaterally HEART: tachycardic, no m/g/t ABDOMEN: Soft, nontender, nondistended, gravid.  DTR's 2+  FHT:  Baseline rate 140s bpm   Variability moderate  Accelerations present   Decelerations none Contractions: none   Labs: No results found for this or any previous visit (from the past 24 hour(s)). Imaging Studies:  No results found.  MDM: appears to be having asthma exacerbation - duonebs q15 x3, EKG, CBC, CXR, peak flow, and steroids now   9:23 PM pt with improved symptoms. Neg  CXR. Normal EKG, resoultion of Left shoulder pain. Very low risk for CAD. Will discharge.  Assessment: Tina Buchanan is  23 y.o. G3P2002 at 4339w2d presents with Asthma exacerbation FWB reassuring and reactive Asthma Exacerbation: Prednison 60mg  daily for 5 days Start budesonide BID Albuterol  PRN F/u in clinic  Tawana ScaleODOM, MICHAEL RYAN 6/13/20157:05 PM  Interpreter phone was used for this encounter.

## 2013-07-14 NOTE — Discharge Instructions (Signed)

## 2013-07-25 LAB — OB RESULTS CONSOLE GC/CHLAMYDIA
Chlamydia: NEGATIVE
Gonorrhea: NEGATIVE

## 2013-07-25 LAB — OB RESULTS CONSOLE GBS: STREP GROUP B AG: NEGATIVE

## 2013-08-08 ENCOUNTER — Inpatient Hospital Stay (HOSPITAL_COMMUNITY)
Admission: AD | Admit: 2013-08-08 | Discharge: 2013-08-08 | Disposition: A | Discharge status: Home / Self Care | Payer: Medicaid Other | Source: Ambulatory Visit | Attending: Obstetrics & Gynecology | Admitting: Obstetrics & Gynecology

## 2013-08-08 ENCOUNTER — Encounter (HOSPITAL_COMMUNITY): Payer: Self-pay | Admitting: *Deleted

## 2013-08-08 DIAGNOSIS — K5900 Constipation, unspecified: Secondary | ICD-10-CM | POA: Diagnosis not present

## 2013-08-08 DIAGNOSIS — O99613 Diseases of the digestive system complicating pregnancy, third trimester: Secondary | ICD-10-CM

## 2013-08-08 DIAGNOSIS — O9989 Other specified diseases and conditions complicating pregnancy, childbirth and the puerperium: Secondary | ICD-10-CM

## 2013-08-08 DIAGNOSIS — O99210 Obesity complicating pregnancy, unspecified trimester: Secondary | ICD-10-CM

## 2013-08-08 DIAGNOSIS — O10019 Pre-existing essential hypertension complicating pregnancy, unspecified trimester: Secondary | ICD-10-CM | POA: Diagnosis not present

## 2013-08-08 DIAGNOSIS — O479 False labor, unspecified: Secondary | ICD-10-CM | POA: Diagnosis present

## 2013-08-08 DIAGNOSIS — O471 False labor at or after 37 completed weeks of gestation: Secondary | ICD-10-CM

## 2013-08-08 DIAGNOSIS — E669 Obesity, unspecified: Secondary | ICD-10-CM | POA: Insufficient documentation

## 2013-08-08 DIAGNOSIS — O99891 Other specified diseases and conditions complicating pregnancy: Secondary | ICD-10-CM

## 2013-08-08 MED ORDER — DOCUSATE SODIUM 100 MG PO CAPS: 100.0000 mg | ORAL_CAPSULE | Freq: Two times a day (BID) | ORAL | Status: AC | PRN

## 2013-08-08 NOTE — MAU Note (Signed)
C/o pressure and cramping in abdomen since yesterday; denies vaginal bleeding or SROM:

## 2013-08-08 NOTE — MAU Note (Signed)
Speaks Arabic; FT outer os and closed inner os today; not tracing ucs; has been constipated for a week; hx of PIH; has borderline BP today; denies headache,blurred vision or dizziness now;

## 2013-08-08 NOTE — Discharge Instructions (Signed)

## 2013-08-08 NOTE — MAU Provider Note (Signed)
Chief Complaint:  Labor Eval   First Provider Initiated Contact with Patient 08/08/13 1125      HPI: Tina PonderRaja A Gater is a 23 y.o. G3P2002 at 4965w6d here for labor check reporting irregular UCs and pelvic pressure. Also has been constipated. Denies leakage of fluid or vaginal bleeding. Good fetal movement.   Pregnancy Course: HRC: CHTN, obesity  Past Medical History: Past Medical History  Diagnosis Date  . Chronic headaches   . Asthma   . Hypertension     Past obstetric history: OB History  Gravida Para Term Preterm AB SAB TAB Ectopic Multiple Living  3 2 2  0 0 0 0 0 0 2    # Outcome Date GA Lbr Len/2nd Weight Sex Delivery Anes PTL Lv  3 CUR           2 TRM 04/19/11 4930w1d 08:21 / 02:20 6 lb 4.3 oz (2.843 kg) M SVD EPI  Y     Comments: caput  1 TRM 12/2008 7378w3d  5 lb 9 oz (2.523 kg) F SVD EPI       Comments: Born WHOG, +GBSurine, no complications      Past Surgical History: Past Surgical History  Procedure Laterality Date  . Tonsillectomy  2007     Family History: Family History  Problem Relation Age of Onset  . Heart disease Mother   . Hypertension Mother   . Diabetes Mother   . Heart disease Father   . Diabetes Brother     Social History: History  Substance Use Topics  . Smoking status: Never Smoker   . Smokeless tobacco: Never Used  . Alcohol Use: No    Allergies: No Known Allergies  Meds:  Prescriptions prior to admission  Medication Sig Dispense Refill  . albuterol (PROVENTIL HFA;VENTOLIN HFA) 108 (90 BASE) MCG/ACT inhaler Inhale 2 puffs into the lungs every 6 (six) hours as needed.  1 Inhaler  3  . Budesonide (PULMICORT FLEXHALER) 90 MCG/ACT inhaler Inhale 1 puff into the lungs 2 (two) times daily.  1 Inhaler  1  . predniSONE (DELTASONE) 20 MG tablet Take 3 tablets (60 mg total) by mouth daily with breakfast.  15 tablet  0    ROS: Pertinent findings in history of present illness.  Physical Exam  Blood pressure 136/86, pulse 110, temperature 98.3  F (36.8 C), temperature source Oral, resp. rate 18, height 5\' 4"  (1.626 m), weight 230 lb 12.8 oz (104.69 kg), last menstrual period 11/09/2012, currently breastfeeding. GENERAL: Well-developed, well-nourished female in no acute distress.  HEENT: normocephalic HEART: normal rate RESP: normal effort ABDOMEN: Soft, non-tender, gravid appropriate for gestational age EXTREMITIES: Nontender, no edema NEURO: alert and oriented  Dilation: Fingertip (FT on outer os but inner os is closed) Station: -3 Exam by:: Morrison Oldee Carter RN  FHT:  Baseline 140 , moderate variability, accelerations present, no decelerations Contractions:  sl UI    Labs: No results found for this or any previous visit (from the past 24 hour(s)).  Imaging:  Dg Chest 2 View  07/14/2013   CLINICAL DATA:  Asthma attack.  EXAM: CHEST  2 VIEW  COMPARISON:  None.  FINDINGS: The heart size is normal. The lung volumes are low. No focal airspace disease is evident. The visualized soft tissues and bony thorax are unremarkable.  IMPRESSION: 1. Low lung volumes. 2. No acute cardiopulmonary disease.   Electronically Signed   By: Gennette Pachris  Mattern M.D.   On: 07/14/2013 21:04   MAU Course: EFM and obs  Assessment: 1. Constipation during pregnancy, third trimester   2. False labor after 37 completed weeks of gestation   G3P2002 5513w6d  Plan: Discharge home Labor precautions and fetal kick counts    Medication List         albuterol 108 (90 BASE) MCG/ACT inhaler  Commonly known as:  PROVENTIL HFA;VENTOLIN HFA  Inhale 1-2 puffs into the lungs every 6 (six) hours as needed for wheezing or shortness of breath.     Budesonide 90 MCG/ACT inhaler  Commonly known as:  PULMICORT FLEXHALER  Inhale 1 puff into the lungs 2 (two) times daily.     docusate sodium 100 MG capsule  Commonly known as:  COLACE  Take 1 capsule (100 mg total) by mouth 2 (two) times daily as needed.     prenatal multivitamin Tabs tablet  Take 1 tablet by mouth  daily at 12 noon.       Follow-up Information   Follow up with [facility]. (Keep your scheduled prenatal appointment)      Danae Orleanseirdre C Cloris Flippo, CNM 08/08/2013 11:26 AM

## 2013-08-17 ENCOUNTER — Other Ambulatory Visit (HOSPITAL_COMMUNITY): Payer: Self-pay | Admitting: Family

## 2013-08-17 DIAGNOSIS — O480 Post-term pregnancy: Secondary | ICD-10-CM

## 2013-08-20 ENCOUNTER — Ambulatory Visit (HOSPITAL_COMMUNITY)
Admission: RE | Admit: 2013-08-20 | Discharge: 2013-08-20 | Disposition: A | Discharge status: Home / Self Care | Payer: Medicaid Other | Source: Ambulatory Visit | Attending: Family | Admitting: Family

## 2013-08-20 DIAGNOSIS — Z3689 Encounter for other specified antenatal screening: Secondary | ICD-10-CM | POA: Diagnosis not present

## 2013-08-20 DIAGNOSIS — O480 Post-term pregnancy: Secondary | ICD-10-CM | POA: Insufficient documentation

## 2013-08-21 ENCOUNTER — Telehealth (HOSPITAL_COMMUNITY): Payer: Self-pay | Admitting: *Deleted

## 2013-08-21 ENCOUNTER — Encounter (HOSPITAL_COMMUNITY): Payer: Self-pay

## 2013-08-21 ENCOUNTER — Inpatient Hospital Stay (HOSPITAL_COMMUNITY)
Admission: AD | Admit: 2013-08-21 | Discharge: 2013-08-22 | Disposition: A | Discharge status: Home / Self Care | Payer: Medicaid Other | Source: Ambulatory Visit | Attending: Obstetrics & Gynecology | Admitting: Obstetrics & Gynecology

## 2013-08-21 ENCOUNTER — Encounter (HOSPITAL_COMMUNITY): Payer: Self-pay | Admitting: *Deleted

## 2013-08-21 DIAGNOSIS — O479 False labor, unspecified: Secondary | ICD-10-CM | POA: Insufficient documentation

## 2013-08-21 NOTE — MAU Note (Signed)
Contractions  

## 2013-08-21 NOTE — Telephone Encounter (Signed)
Preadmission screen  

## 2013-08-21 NOTE — MAU Note (Signed)
Recheck pt in one hour per [facility] CNM

## 2013-08-22 ENCOUNTER — Encounter (HOSPITAL_COMMUNITY): Payer: Self-pay | Admitting: *Deleted

## 2013-08-22 ENCOUNTER — Inpatient Hospital Stay (HOSPITAL_COMMUNITY)
Admission: AD | Admit: 2013-08-22 | Discharge: 2013-08-23 | DRG: 774 | Disposition: A | Discharge status: Home / Self Care | Payer: Medicaid Other | Source: Ambulatory Visit | Attending: Obstetrics & Gynecology | Admitting: Obstetrics & Gynecology

## 2013-08-22 DIAGNOSIS — O328XX0 Maternal care for other malpresentation of fetus, not applicable or unspecified: Secondary | ICD-10-CM | POA: Diagnosis present

## 2013-08-22 DIAGNOSIS — Z8249 Family history of ischemic heart disease and other diseases of the circulatory system: Secondary | ICD-10-CM

## 2013-08-22 DIAGNOSIS — O1002 Pre-existing essential hypertension complicating childbirth: Principal | ICD-10-CM | POA: Diagnosis present

## 2013-08-22 DIAGNOSIS — Z833 Family history of diabetes mellitus: Secondary | ICD-10-CM

## 2013-08-22 DIAGNOSIS — O479 False labor, unspecified: Secondary | ICD-10-CM | POA: Diagnosis present

## 2013-08-22 DIAGNOSIS — IMO0001 Reserved for inherently not codable concepts without codable children: Secondary | ICD-10-CM

## 2013-08-22 LAB — CBC
HCT: 33.2 % — ABNORMAL LOW (ref 36.0–46.0)
HEMOGLOBIN: 11.1 g/dL — AB (ref 12.0–15.0)
MCH: 25.6 pg — ABNORMAL LOW (ref 26.0–34.0)
MCHC: 33.4 g/dL (ref 30.0–36.0)
MCV: 76.5 fL — AB (ref 78.0–100.0)
Platelets: 185 10*3/uL (ref 150–400)
RBC: 4.34 MIL/uL (ref 3.87–5.11)
RDW: 15.1 % (ref 11.5–15.5)
WBC: 10.5 10*3/uL (ref 4.0–10.5)

## 2013-08-22 LAB — TYPE AND SCREEN
ABO/RH(D): B POS
Antibody Screen: NEGATIVE

## 2013-08-22 LAB — RPR

## 2013-08-22 LAB — ABO/RH: ABO/RH(D): B POS

## 2013-08-22 MED ORDER — DIBUCAINE 1 % RE OINT: 1.0000 "application " | TOPICAL_OINTMENT | RECTAL | Status: DC | PRN

## 2013-08-22 MED ORDER — CITRIC ACID-SODIUM CITRATE 334-500 MG/5ML PO SOLN: 30.0000 mL | ORAL | Status: DC | PRN

## 2013-08-22 MED ORDER — ONDANSETRON HCL 4 MG PO TABS: 4.0000 mg | ORAL_TABLET | ORAL | Status: DC | PRN

## 2013-08-22 MED ORDER — PNEUMOCOCCAL VAC POLYVALENT 25 MCG/0.5ML IJ INJ
0.5000 mL | INJECTION | INTRAMUSCULAR | Status: AC
  Administered 2013-08-23: 0.5 mL via INTRAMUSCULAR
  Filled 2013-08-22: qty 0.5

## 2013-08-22 MED ORDER — LIDOCAINE HCL (PF) 1 % IJ SOLN
30.0000 mL | INTRAMUSCULAR | Status: DC | PRN
  Filled 2013-08-22: qty 30

## 2013-08-22 MED ORDER — ONDANSETRON HCL 4 MG/2ML IJ SOLN: 4.0000 mg | INTRAMUSCULAR | Status: DC | PRN

## 2013-08-22 MED ORDER — DIPHENHYDRAMINE HCL 25 MG PO CAPS: 25.0000 mg | ORAL_CAPSULE | Freq: Four times a day (QID) | ORAL | Status: DC | PRN

## 2013-08-22 MED ORDER — ZOLPIDEM TARTRATE 5 MG PO TABS: 5.0000 mg | ORAL_TABLET | Freq: Every evening | ORAL | Status: DC | PRN

## 2013-08-22 MED ORDER — ALBUTEROL SULFATE (2.5 MG/3ML) 0.083% IN NEBU
3.0000 mL | INHALATION_SOLUTION | RESPIRATORY_TRACT | Status: DC | PRN
  Administered 2013-08-22: 3 mL via RESPIRATORY_TRACT
  Filled 2013-08-22: qty 3

## 2013-08-22 MED ORDER — WITCH HAZEL-GLYCERIN EX PADS: 1.0000 "application " | MEDICATED_PAD | CUTANEOUS | Status: DC | PRN

## 2013-08-22 MED ORDER — IBUPROFEN 600 MG PO TABS
600.0000 mg | ORAL_TABLET | Freq: Four times a day (QID) | ORAL | Status: DC
  Administered 2013-08-22 – 2013-08-23 (×6): 600 mg via ORAL
  Filled 2013-08-22 (×5): qty 1

## 2013-08-22 MED ORDER — LACTATED RINGERS IV SOLN: 500.0000 mL | INTRAVENOUS | Status: DC | PRN

## 2013-08-22 MED ORDER — LACTATED RINGERS IV SOLN
INTRAVENOUS | Status: DC
  Administered 2013-08-22: 04:00:00 via INTRAVENOUS

## 2013-08-22 MED ORDER — OXYTOCIN BOLUS FROM INFUSION
500.0000 mL | INTRAVENOUS | Status: DC
  Administered 2013-08-22: 500 mL via INTRAVENOUS

## 2013-08-22 MED ORDER — IBUPROFEN 600 MG PO TABS
600.0000 mg | ORAL_TABLET | Freq: Four times a day (QID) | ORAL | Status: DC | PRN
  Filled 2013-08-22: qty 1

## 2013-08-22 MED ORDER — OXYCODONE-ACETAMINOPHEN 5-325 MG PO TABS
1.0000 | ORAL_TABLET | ORAL | Status: DC | PRN
  Administered 2013-08-22: 1 via ORAL
  Filled 2013-08-22: qty 2

## 2013-08-22 MED ORDER — OXYCODONE-ACETAMINOPHEN 5-325 MG PO TABS
1.0000 | ORAL_TABLET | ORAL | Status: DC | PRN
  Filled 2013-08-22: qty 1

## 2013-08-22 MED ORDER — SENNOSIDES-DOCUSATE SODIUM 8.6-50 MG PO TABS
2.0000 | ORAL_TABLET | ORAL | Status: DC
  Administered 2013-08-23: 2 via ORAL
  Filled 2013-08-22: qty 2

## 2013-08-22 MED ORDER — TETANUS-DIPHTH-ACELL PERTUSSIS 5-2.5-18.5 LF-MCG/0.5 IM SUSP: 0.5000 mL | Freq: Once | INTRAMUSCULAR | Status: DC

## 2013-08-22 MED ORDER — BUTORPHANOL TARTRATE 1 MG/ML IJ SOLN: 1.0000 mg | Freq: Once | INTRAMUSCULAR | Status: DC

## 2013-08-22 MED ORDER — OXYCODONE-ACETAMINOPHEN 5-325 MG PO TABS: 1.0000 | ORAL_TABLET | Freq: Once | ORAL | Status: DC

## 2013-08-22 MED ORDER — PRENATAL MULTIVITAMIN CH
1.0000 | ORAL_TABLET | Freq: Every day | ORAL | Status: DC
  Administered 2013-08-22 – 2013-08-23 (×2): 1 via ORAL
  Filled 2013-08-22 (×2): qty 1

## 2013-08-22 MED ORDER — BENZOCAINE-MENTHOL 20-0.5 % EX AERO: 1.0000 "application " | INHALATION_SPRAY | CUTANEOUS | Status: DC | PRN

## 2013-08-22 MED ORDER — OXYTOCIN 40 UNITS IN LACTATED RINGERS INFUSION - SIMPLE MED
62.5000 mL/h | INTRAVENOUS | Status: DC
  Filled 2013-08-22: qty 1000

## 2013-08-22 MED ORDER — ACETAMINOPHEN 325 MG PO TABS: 650.0000 mg | ORAL_TABLET | ORAL | Status: DC | PRN

## 2013-08-22 MED ORDER — SIMETHICONE 80 MG PO CHEW: 80.0000 mg | CHEWABLE_TABLET | ORAL | Status: DC | PRN

## 2013-08-22 MED ORDER — ONDANSETRON HCL 4 MG/2ML IJ SOLN: 4.0000 mg | Freq: Four times a day (QID) | INTRAMUSCULAR | Status: DC | PRN

## 2013-08-22 MED ORDER — LANOLIN HYDROUS EX OINT: TOPICAL_OINTMENT | CUTANEOUS | Status: DC | PRN

## 2013-08-22 NOTE — H&P (Signed)
I examined pt and agree with documentation above and resident plan of care. Williamson Cavanah N Karim, CNM  

## 2013-08-22 NOTE — H&P (Signed)
Tina Buchanan is Buchanan 23 y.o. female  G3P2002 at 2238w6d gestation presenting for contractions. History  Patient speaks Arabic and unable to provide history due to precipitous delivery. Patient previously evaluated in maternal admissions unit and was 3.5cm dilated with no change after 2 hours. She was sent home several hours ago. Patient brought here by EMS after calling with complaint of increasing pain with contractions. She was found to be 8cm dilated on admission and sent straight to L&D. Patient began screaming in discomfort and was found to be complete before any further history could be obtained. Chart was briefly reviewed and she was GBS negative with otherwise normal prenatal labs.  OB History   Grav Para Term Preterm Abortions TAB SAB Ect Mult Living   3 2 2  0 0 0 0 0 0 2     Past Medical History  Diagnosis Date  . Chronic headaches   . Asthma   . Hypertension    Past Surgical History  Procedure Laterality Date  . Tonsillectomy  2007   Family History: family history includes Diabetes in her brother and mother; Heart disease in her father and mother; Hypertension in her mother. Social History:  reports that she has never smoked. She has never used smokeless tobacco. She reports that she does not drink alcohol or use illicit drugs.   Prenatal Transfer Tool  Maternal Diabetes: No Genetic Screening: Declined Maternal Ultrasounds/Referrals: Normal Fetal Ultrasounds or other Referrals:  Fetal echo, Referred to Materal Fetal Medicine  Maternal Substance Abuse:  No Significant Maternal Medications:  None Significant Maternal Lab Results:  None Other Comments:  None  Review of Systems  Unable to perform ROS: language    Dilation: 10 Effacement (%): 100 Station: +2 Exam by:: Tina morris rn Blood pressure 138/90, pulse 86, resp. rate 20, last menstrual period 11/09/2012, currently breastfeeding. Exam Physical Exam  Constitutional: She appears well-developed and  well-nourished.  Squirming in bed, shouting in arabic  Cardiovascular: Normal rate and regular rhythm.   Respiratory: Effort normal and breath sounds normal.  Genitourinary: Vagina normal.  10cm, 100% effaced, +3 station, ruptured  Skin: Skin is warm and dry.    Prenatal labs: ABO, Rh: B/Positive/-- (12/22 0000) Antibody: Negative (12/22 0000) Rubella: Immune (12/22 0000) RPR: Nonreactive (12/22 0000)  HBsAg: Negative (12/22 0000)  HIV: Non-reactive (12/22 0000)  GBS: Negative (06/24 0000)   Assessment/Plan: 23 year old G3P2002 at 5538w6d gestation admitted for contractions.  Patient delivered without complication immediately after arrival to labor and delivery. See Delivery Summary for details. Monitor blood pressure with mildly elevated reading. Patient will be ready for transfer to mother baby.   Patient seen with Tina Buchanan.  Tina Buchanan 08/22/2013, 4:47 AM

## 2013-08-22 NOTE — Progress Notes (Signed)
UR chart review completed.  

## 2013-08-22 NOTE — Discharge Instructions (Signed)
Braxton Hicks Contractions °Contractions of the uterus can occur throughout pregnancy. Contractions are not always a sign that you are in labor.  °WHAT ARE BRAXTON HICKS CONTRACTIONS?  °Contractions that occur before labor are called Braxton Hicks contractions, or false labor. Toward the end of pregnancy (32-34 weeks), these contractions can develop more often and may become more forceful. This is not true labor because these contractions do not result in opening (dilatation) and thinning of the cervix. They are sometimes difficult to tell apart from true labor because these contractions can be forceful and people have different pain tolerances. You should not feel embarrassed if you go to the hospital with false labor. Sometimes, the only way to tell if you are in true labor is for your health care provider to look for changes in the cervix. °If there are no prenatal problems or other health problems associated with the pregnancy, it is completely safe to be sent home with false labor and await the onset of true labor. °HOW CAN YOU TELL THE DIFFERENCE BETWEEN TRUE AND FALSE LABOR? °False Labor °· The contractions of false labor are usually shorter and not as hard as those of true labor.   °· The contractions are usually irregular.   °· The contractions are often felt in the front of the lower abdomen and in the groin.   °· The contractions may go away when you walk around or change positions while lying down.   °· The contractions get weaker and are shorter lasting as time goes on.   °· The contractions do not usually become progressively stronger, regular, and closer together as with true labor.   °True Labor °· Contractions in true labor last 30-70 seconds, become very regular, usually become more intense, and increase in frequency.   °· The contractions do not go away with walking.   °· The discomfort is usually felt in the top of the uterus and spreads to the lower abdomen and low back.   °· True labor can be  determined by your health care provider with an exam. This will show that the cervix is dilating and getting thinner.   °WHAT TO REMEMBER °· Keep up with your usual exercises and follow other instructions given by your health care provider.   °· Take medicines as directed by your health care provider.   °· Keep your regular prenatal appointments.   °· Eat and drink lightly if you think you are going into labor.   °· If Braxton Hicks contractions are making you uncomfortable:   °¨ Change your position from lying down or resting to walking, or from walking to resting.   °¨ Sit and rest in a tub of warm water.   °¨ Drink 2-3 glasses of water. Dehydration may cause these contractions.   °¨ Do slow and deep breathing several times an hour.   °WHEN SHOULD I SEEK IMMEDIATE MEDICAL CARE? °Seek immediate medical care if: °· Your contractions become stronger, more regular, and closer together.   °· You have fluid leaking or gushing from your vagina.   °· You have a fever.   °· You pass blood-tinged mucus.   °· You have vaginal bleeding.   °· You have continuous abdominal pain.   °· You have low back pain that you never had before.   °· You feel your baby's head pushing down and causing pelvic pressure.   °· Your baby is not moving as much as it used to.   °Document Released: 01/18/2005 Document Revised: 01/23/2013 Document Reviewed: 10/30/2012 °ExitCare® Patient Information ©2015 [employer]. This information is not intended to replace advice given to you by your health care   provider. Make sure you discuss any questions you have with your health care provider. ° °Fetal Movement Counts °Patient Name: __________________________________________________ Patient Due Date: ____________________ °Performing a fetal movement count is highly recommended in high-risk pregnancies, but it is good for every pregnant woman to do. Your caregiver may ask you to start counting fetal movements at 28 weeks of the pregnancy. Fetal movements  often increase: °· After eating a full meal. °· After physical activity. °· After eating or drinking something sweet or cold. °· At rest. °Pay attention to when you feel the baby is most active. This will help you notice a pattern of your baby's sleep and wake cycles and what factors contribute to an increase in fetal movement. It is important to perform a fetal movement count at the same time each day when your baby is normally most active.  °HOW TO COUNT FETAL MOVEMENTS °1. Find a quiet and comfortable area to sit or lie down on your left side. Lying on your left side provides the best blood and oxygen circulation to your baby. °2. Write down the day and time on a sheet of paper or in a journal. °3. Start counting kicks, flutters, swishes, rolls, or jabs in a 2 hour period. You should feel at least 10 movements within 2 hours. °4. If you do not feel 10 movements in 2 hours, wait 2-3 hours and count again. Look for a change in the pattern or not enough counts in 2 hours. °SEEK MEDICAL CARE IF: °· You feel less than 10 counts in 2 hours, tried twice. °· There is no movement in over an hour. °· The pattern is changing or taking longer each day to reach 10 counts in 2 hours. °· You feel the baby is not moving as he or she usually does. °Date: ____________ Movements: ____________ Start time: ____________ Finish time: ____________  °Date: ____________ Movements: ____________ Start time: ____________ Finish time: ____________ °Date: ____________ Movements: ____________ Start time: ____________ Finish time: ____________ °Date: ____________ Movements: ____________ Start time: ____________ Finish time: ____________ °Date: ____________ Movements: ____________ Start time: ____________ Finish time: ____________ °Date: ____________ Movements: ____________ Start time: ____________ Finish time: ____________ °Date: ____________ Movements: ____________ Start time: ____________ Finish time: ____________ °Date: ____________  Movements: ____________ Start time: ____________ Finish time: ____________  °Date: ____________ Movements: ____________ Start time: ____________ Finish time: ____________ °Date: ____________ Movements: ____________ Start time: ____________ Finish time: ____________ °Date: ____________ Movements: ____________ Start time: ____________ Finish time: ____________ °Date: ____________ Movements: ____________ Start time: ____________ Finish time: ____________ °Date: ____________ Movements: ____________ Start time: ____________ Finish time: ____________ °Date: ____________ Movements: ____________ Start time: ____________ Finish time: ____________ °Date: ____________ Movements: ____________ Start time: ____________ Finish time: ____________  °Date: ____________ Movements: ____________ Start time: ____________ Finish time: ____________ °Date: ____________ Movements: ____________ Start time: ____________ Finish time: ____________ °Date: ____________ Movements: ____________ Start time: ____________ Finish time: ____________ °Date: ____________ Movements: ____________ Start time: ____________ Finish time: ____________ °Date: ____________ Movements: ____________ Start time: ____________ Finish time: ____________ °Date: ____________ Movements: ____________ Start time: ____________ Finish time: ____________ °Date: ____________ Movements: ____________ Start time: ____________ Finish time: ____________  °Date: ____________ Movements: ____________ Start time: ____________ Finish time: ____________ °Date: ____________ Movements: ____________ Start time: ____________ Finish time: ____________ °Date: ____________ Movements: ____________ Start time: ____________ Finish time: ____________ °Date: ____________ Movements: ____________ Start time: ____________ Finish time: ____________ °Date: ____________ Movements: ____________ Start time: ____________ Finish time: ____________ °Date: ____________ Movements: ____________ Start time:  ____________ Finish time: ____________ °Date: ____________ Movements: ____________   Start time: ____________ Finish time: ____________  °Date: ____________ Movements: ____________ Start time: ____________ Finish time: ____________ °Date: ____________ Movements: ____________ Start time: ____________ Finish time: ____________ °Date: ____________ Movements: ____________ Start time: ____________ Finish time: ____________ °Date: ____________ Movements: ____________ Start time: ____________ Finish time: ____________ °Date: ____________ Movements: ____________ Start time: ____________ Finish time: ____________ °Date: ____________ Movements: ____________ Start time: ____________ Finish time: ____________ °Date: ____________ Movements: ____________ Start time: ____________ Finish time: ____________  °Date: ____________ Movements: ____________ Start time: ____________ Finish time: ____________ °Date: ____________ Movements: ____________ Start time: ____________ Finish time: ____________ °Date: ____________ Movements: ____________ Start time: ____________ Finish time: ____________ °Date: ____________ Movements: ____________ Start time: ____________ Finish time: ____________ °Date: ____________ Movements: ____________ Start time: ____________ Finish time: ____________ °Date: ____________ Movements: ____________ Start time: ____________ Finish time: ____________ °Date: ____________ Movements: ____________ Start time: ____________ Finish time: ____________  °Date: ____________ Movements: ____________ Start time: ____________ Finish time: ____________ °Date: ____________ Movements: ____________ Start time: ____________ Finish time: ____________ °Date: ____________ Movements: ____________ Start time: ____________ Finish time: ____________ °Date: ____________ Movements: ____________ Start time: ____________ Finish time: ____________ °Date: ____________ Movements: ____________ Start time: ____________ Finish time: ____________ °Date:  ____________ Movements: ____________ Start time: ____________ Finish time: ____________ °Date: ____________ Movements: ____________ Start time: ____________ Finish time: ____________  °Date: ____________ Movements: ____________ Start time: ____________ Finish time: ____________ °Date: ____________ Movements: ____________ Start time: ____________ Finish time: ____________ °Date: ____________ Movements: ____________ Start time: ____________ Finish time: ____________ °Date: ____________ Movements: ____________ Start time: ____________ Finish time: ____________ °Date: ____________ Movements: ____________ Start time: ____________ Finish time: ____________ °Date: ____________ Movements: ____________ Start time: ____________ Finish time: ____________ °Document Released: 02/17/2006 Document Revised: 01/05/2012 Document Reviewed: 11/15/2011 °Exit[employer]® Patient Information ©2015 [employer]. This information is not intended to replace advice given to you by your health care provider. Make sure you discuss any questions you have with your health care provider. ° °

## 2013-08-22 NOTE — MAU Note (Signed)
AT 0355  PT ARRIVED VIA EMS  WITH 2 CHILDREN -  HUSBAND HAD GONE TO WORK AND SHE TRIED TO CALL BUT NO ANSWER.     HAD JUST LEFT AT  0030- HOME  AND HAS RETURNED WITH [facility].    VE - 8 CM AND  [facility]- 130.  TO RM 174 VIA STRETCHER  AND ASHLYN- PARAMEDIC-  STAYING WITH  CHILDREN IN ROOM   174.

## 2013-08-22 NOTE — Lactation Note (Addendum)
This note was copied from the chart of Tina Buchanan. Lactation Consultation Note: Family member interpreting for me. Baby now 4 hours old and mom reports she has had 2 good feedings. Last LS by RN 9. Experienced BF mom. Baby asleep in bassinet at this time. No questions at present. Reviewed feeding cues and encouraged to feed whenever she sees them. To call for assist prn  Patient Name: Tina Paris [name] JYNWG'NToday's Date: 08/22/2013 Reason for consult: Initial assessment;Infant < 6lbs   Maternal Data Formula Feeding for Exclusion: Yes Reason for exclusion: Mother's choice to formula and breast feed on admission Infant to breast within first hour of birth: Yes Does the patient have breastfeeding experience prior to this delivery?: Yes  Feeding   LATCH Score/Interventions  Lactation Tools Discussed/Used     Consult Status Consult Status: Follow-up Date: 08/23/13 Follow-up type: In-patient    Pamelia HoitWeeks, Berdene Askari D 08/22/2013, 9:07 AM

## 2013-08-23 MED ORDER — DOCUSATE SODIUM 100 MG PO CAPS: 100.0000 mg | ORAL_CAPSULE | Freq: Two times a day (BID) | ORAL | Status: AC

## 2013-08-23 MED ORDER — NORETHINDRONE 0.35 MG PO TABS: 1.0000 | ORAL_TABLET | Freq: Every day | ORAL | Status: AC

## 2013-08-23 MED ORDER — NORETHINDRONE 0.35 MG PO TABS: 1.0000 | ORAL_TABLET | Freq: Every day | ORAL | Status: DC

## 2013-08-23 MED ORDER — DOCUSATE SODIUM 100 MG PO CAPS: 100.0000 mg | ORAL_CAPSULE | Freq: Two times a day (BID) | ORAL | Status: DC

## 2013-08-23 NOTE — Progress Notes (Signed)
Patient and her husband given discharge instructions by using the language line in Arabic and they both verbalized understanding and signed discharge forms.

## 2013-08-23 NOTE — Discharge Summary (Signed)
Attestation of Attending Supervision of Advanced Practitioner (PA/CNM/NP): Evaluation and management procedures were performed by the Advanced Practitioner under my supervision and collaboration.  I have reviewed the Advanced Practitioner's note and chart, and I agree with the management and plan.  Hillman Attig, MD, FACOG Attending Obstetrician & Gynecologist Faculty Practice, [facility]   

## 2013-08-23 NOTE — Discharge Summary (Signed)
Obstetric Discharge Summary Reason for Admission: onset of labor Prenatal Procedures: none Intrapartum Procedures: spontaneous vaginal delivery Postpartum Procedures: none Complications-Operative and Postpartum: none  Delivery Note At 4:33 AM a viable female was delivered via Vaginal, Spontaneous Delivery (Presentation: ;  ).  APGAR: 7, 9; weight 6 lb 2.6 oz (2795 g).   Placenta status: Intact, Spontaneous.  Cord: 3 vessels with the following complications: None  Anesthesia: None  Episiotomy: None Lacerations: None Suture Repair: n/a Est. Blood Loss (mL): 100    Hospital Course:  Active Problems:   Active labor   Vaginal delivery   Today: No acute events overnight.  Pt denies problems with ambulating, voiding or po intake.  She denies nausea or vomiting.  Pain is well controlled.  She has had flatus. She has not had bowel movement.  Lochia Moderate.  Plan for birth control is  OCPs.  Method of Feeding: breast and bottle  Tina Buchanan is a 23 y.o. [id number] s/p TSVD.  Patient presented in active labor.  She has postpartum course that was uncomplicated including no problems with ambulating, PO intake, urination, pain, or bleeding. The pt feels ready to go home and  will be discharged with outpatient follow-up.    H/H: Lab Results  Component Value Date/Time   HGB 11.1* 08/22/2013  4:20 AM   HCT 33.2* 08/22/2013  4:20 AM   HCT 38 11/11/2010  8:36 AM    Discharge Diagnoses: Term Pregnancy-delivered  Discharge Information: Date: 08/23/13 Activity: pelvic rest Diet: routine  Medications: Colace and micronor Breast feeding:  Yes Condition: stable Instructions: refer to handout Discharge to: home      Medication List         albuterol 108 (90 BASE) MCG/ACT inhaler  Commonly known as:  PROVENTIL HFA;VENTOLIN HFA  Inhale 1-2 puffs into the lungs every 6 (six) hours as needed for wheezing or shortness of breath.     Budesonide 90 MCG/ACT inhaler  Commonly known as:   PULMICORT FLEXHALER  Inhale 1 puff into the lungs 2 (two) times daily.     docusate sodium 100 MG capsule  Commonly known as:  COLACE  Take 1 capsule (100 mg total) by mouth 2 (two) times daily as needed.     docusate sodium 100 MG capsule  Commonly known as:  COLACE  Take 1 capsule (100 mg total) by mouth 2 (two) times daily.     norethindrone 0.35 MG tablet  Commonly known as:  ORTHO MICRONOR  Take 1 tablet (0.35 mg total) by mouth daily.     prenatal multivitamin Tabs tablet  Take 1 tablet by mouth daily at 12 noon.           Follow-up Information   Follow up In 6 weeks.      Perry MountACOSTA,Jove Beyl ROCIO ,MD OB Fellow 08/23/2013,8:12 AM

## 2013-08-23 NOTE — Lactation Note (Signed)
This note was copied from the chart of Tina Eavan Hansson. Lactation Consultation Note      Follow up consult with this mom and baby, now 30 hours old, and 441 weeks CGA, weighing 5 lb 15.2 ounces on discharge today. Dad reports they have an appointment set up with their pediatrician for today. I observed mom breast feeding, and baby latched easily with strong suckles. Some basic teaching done form the baby and me book, and I spoke to mom about the risks of using formula(lead). Mom knows to call lactation for questions/concerns.  Patient Name: Tina Paris LoreRaja Fennell MWNUU'VToday's Date: 08/23/2013 Reason for consult: Follow-up assessment;Infant < 6lbs   Maternal Data    Feeding Feeding Type: Breast Fed  LATCH Score/Interventions Latch: Grasps breast easily, tongue down, lips flanged, rhythmical sucking.  Audible Swallowing: Spontaneous and intermittent Intervention(s): Skin to skin  Type of Nipple: Everted at rest and after stimulation  Comfort (Breast/Nipple): Soft / non-tender     Hold (Positioning): No assistance needed to correctly position infant at breast.  LATCH Score: 10  Lactation Tools Discussed/Used     Consult Status Consult Status: Complete    Alfred LevinsLee, Lyric Rossano Anne 08/23/2013, 10:49 AM

## 2013-08-23 NOTE — Discharge Instructions (Signed)

## 2013-08-26 ENCOUNTER — Inpatient Hospital Stay (HOSPITAL_COMMUNITY): Admission: RE | Admit: 2013-08-26 | Payer: Medicaid Other | Source: Ambulatory Visit

## 2013-09-24 ENCOUNTER — Encounter (HOSPITAL_COMMUNITY): Payer: Self-pay | Admitting: Emergency Medicine

## 2013-09-24 ENCOUNTER — Emergency Department (HOSPITAL_COMMUNITY)
Admission: EM | Admit: 2013-09-24 | Discharge: 2013-09-24 | Disposition: A | Discharge status: Home / Self Care | Payer: Medicaid Other | Attending: Emergency Medicine | Admitting: Emergency Medicine

## 2013-09-24 DIAGNOSIS — R519 Headache, unspecified: Secondary | ICD-10-CM

## 2013-09-24 DIAGNOSIS — R51 Headache: Secondary | ICD-10-CM | POA: Diagnosis not present

## 2013-09-24 DIAGNOSIS — I10 Essential (primary) hypertension: Secondary | ICD-10-CM | POA: Diagnosis not present

## 2013-09-24 DIAGNOSIS — N644 Mastodynia: Secondary | ICD-10-CM | POA: Insufficient documentation

## 2013-09-24 DIAGNOSIS — Z79899 Other long term (current) drug therapy: Secondary | ICD-10-CM | POA: Insufficient documentation

## 2013-09-24 DIAGNOSIS — G8929 Other chronic pain: Secondary | ICD-10-CM | POA: Diagnosis not present

## 2013-09-24 DIAGNOSIS — J45909 Unspecified asthma, uncomplicated: Secondary | ICD-10-CM | POA: Insufficient documentation

## 2013-09-24 NOTE — ED Notes (Signed)
Pt c/o swelling and pain to left breast; pt is breast feeding at present and 1 month post partum; pt sts generalized HA

## 2013-09-24 NOTE — ED Provider Notes (Signed)
CSN: [id number]     Arrival date & time 09/24/13  1312 History   First MD Initiated Contact with Patient 09/24/13 1446     Chief Complaint  Patient presents with  . Breast Pain  . Headache      The history is provided by the patient and a significant other. A language interpreter was used Teaching laboratory technician # [id number]).  pt presents for right breast pain for "A year" and it is worsening.  Nothing improves her pain She also reports headache on/off for "a year" and she has been taking tylenol for pain No fever/vomitng No cp/sob   She reports she is breastfeeding  Past Medical History  Diagnosis Date  . Chronic headaches   . Asthma   . Hypertension    Past Surgical History  Procedure Laterality Date  . Tonsillectomy  2007   Family History  Problem Relation Age of Onset  . Heart disease Mother   . Hypertension Mother   . Diabetes Mother   . Heart disease Father   . Diabetes Brother    History  Substance Use Topics  . Smoking status: Never Smoker   . Smokeless tobacco: Never Used  . Alcohol Use: No   OB History   Grav Para Term Preterm Abortions TAB SAB Ect Mult Living   0 0 0 0 0 0 3     Review of Systems  Constitutional: Negative for fever.  Gastrointestinal: Negative for vomiting.  Neurological: Positive for headaches.      Allergies  Review of patient's allergies indicates no known allergies.  Home Medications   Prior to Admission medications   Medication Sig Start Date End Date Taking? Authorizing Provider  albuterol (PROVENTIL HFA;VENTOLIN HFA) 108 (90 BASE) MCG/ACT inhaler Inhale 1-2 puffs into the lungs every 6 (six) hours as needed for wheezing or shortness of breath.    Historical Provider, MD  Budesonide (PULMICORT FLEXHALER) 90 MCG/ACT inhaler Inhale 1 puff into the lungs 2 (two) times daily. 07/14/13   Minta Balsam, MD  docusate sodium (COLACE) 100 MG capsule Take 1 capsule (100 mg total) by mouth 2 (two) times daily as needed. 08/08/13   Deirdre C  Poe, CNM  docusate sodium (COLACE) 100 MG capsule Take 1 capsule (100 mg total) by mouth 2 (two) times daily. 08/23/13   Perry Mount, MD  norethindrone (ORTHO MICRONOR) 0.35 MG tablet Take 1 tablet (0.35 mg total) by mouth daily. 08/23/13   Perry Mount, MD  Prenatal Vit-Fe Fumarate-FA (PRENATAL MULTIVITAMIN) TABS tablet Take 1 tablet by mouth daily at 12 noon.    Historical Provider, MD   BP 107/67  Pulse 87  Temp(Src) 99.3 F (37.4 C) (Oral)  Resp 20  SpO2 100% Physical Exam CONSTITUTIONAL: Well developed/well nourished HEAD: Normocephalic/atraumatic EYES: EOMI/PERRL ENMT: Mucous membranes moist NECK: supple no meningeal signs CV: S1/S2 noted, no murmurs/rubs/gallops noted LUNGS: Lungs are clear to auscultation bilaterally, no apparent distress ABDOMEN: soft, nontender, no rebound or guarding NEURO: Pt is awake/alert, moves all extremitiesx4, no arm or leg drift.  No facial droop.  No ataxia.  Pt is conversant EXTREMITIES: pulses normal, full ROM. No tenderness noted to right axilla SKIN: warm, color normal Small raised area to right outer breast without warmth/erythema/crepitus.  There is no breast discharge PSYCH: no abnormalities of mood noted  ED Course  Procedures for HA - reports intermittent HA for one year, neuro exam unremarkable and she is well appearing.  She can f/u as outpatient  For area on breast, no signs of mastitis.  Suspect this could have been abscess previously that has resolved.  I don't feel it represents any sort of cancerous lesion but I have instructed her to f/u with primary physician.  Advised warm compresses for now and tylenol OTC  MDM   Final diagnoses:  Nonintractable headache, unspecified chronicity pattern, unspecified headache type  Breast pain, right    Nursing notes including past medical history and social history reviewed and considered in documentation     Joya Gaskins, MD 09/24/13 1515

## 2013-09-24 NOTE — Discharge Instructions (Signed)
APPLY WARM COMPRESSES TO PAINFUL AREA RETURN TO ER FOR WORSENED PAIN, REDNESS, SWELLING OR FEVER ABOVE 100.36F   You are having a headache. No specific cause was found today for your headache. It may have been a migraine or other cause of headache. Stress, anxiety, fatigue, and depression are common triggers for headaches. Your headache today does not appear to be life-threatening or require hospitalization, but often the exact cause of headaches is not determined in the emergency department. Therefore, follow-up with your doctor is very important to find out what may have caused your headache, and whether or not you need any further diagnostic testing or treatment. Sometimes headaches can appear benign (not harmful), but then more serious symptoms can develop which should prompt an immediate re-evaluation by your doctor or the emergency department.  SEEK MEDICAL ATTENTION IF:  You develop possible problems with medications prescribed.  The medications don't resolve your headache, if it recurs , or if you have multiple episodes of vomiting or can't take fluids. You have a change from the usual headache.  RETURN IMMEDIATELY IF you develop a sudden, severe headache or confusion, become poorly responsive or faint, develop a fever above 100.36F or problem breathing, have a change in speech, vision, swallowing, or understanding, or develop new weakness, numbness, tingling, incoordination, or have a seizure.    Emergency Department Resource Guide 1) Find a Doctor and Pay Out of Pocket Although you won't have to find out who is covered by your insurance plan, it is a good idea to ask around and get recommendations. You will then need to call the office and see if the doctor you have chosen will accept you as a new patient and what types of options they offer for patients who are self-pay. Some doctors offer discounts or will set up payment plans for their patients who do not have insurance, but you will  need to ask so you aren't surprised when you get to your appointment.  2) Contact Your Local Health Department Not all health departments have doctors that can see patients for sick visits, but many do, so it is worth a call to see if yours does. If you don't know where your local health department is, you can check in your phone book. The CDC also has a tool to help you locate your state's health department, and many state websites also have listings of all of their local health departments.  3) Find a Walk-in Clinic If your illness is not likely to be very severe or complicated, you may want to try a walk in clinic. These are popping up all over the country in pharmacies, drugstores, and shopping centers. They're usually staffed by nurse practitioners or physician assistants that have been trained to treat common illnesses and complaints. They're usually fairly quick and inexpensive. However, if you have serious medical issues or chronic medical problems, these are probably not your best option.  No Primary Care Doctor: - Call Health Connect at  [phone] - they can help you locate a primary care doctor that  accepts your insurance, provides certain services, etc. - Physician Referral Service- [phone]  Chronic Pain Problems: Organization         Address  Phone   Notes  [facility]  [phone] Patients need to be referred by their primary care doctor.   Medication Assistance: Organization         Address  Phone   Notes  [facility] Medication Assistance Program [phone]  [address] [place], [place] [address] [phone] --Must be a resident of [facility] -- Must have NO insurance coverage whatsoever (no Medicaid/ Medicare, etc.) -- The pt. MUST have a primary care doctor that directs their care regularly and follows them in the community   [facility]  [phone]   [employer]  ([phone]    Agencies that provide inexpensive medical  care: Organization         Address  Phone   Notes  [facility]  [phone]   [facility]    [phone]   [facility] [address] [place], [place] [address] [phone]   [facility] [address] [place], [place] [phone]   Planned Parenthood    [phone]   [facility]    [phone]   [facility]  [address], [place] Phone:  [phone], Fax:  [phone] Hours of Operation:  9 am - 6 pm, M-F.  Also accepts Medicaid/Medicare and self-pay.  [facility]  [address], [place] Phone: [phone], Fax: [phone]. Hours of Operation:  8:30 am - 5:30 pm, M-F.  Also accepts Medicaid and self-pay.  [facility] [place] [address], [place] Phone: [phone]   [facility] [address] Natasha Bence [place], [place] [phone], Ext. [phone] Mondays & Thursdays: 7-9 AM.  First 15 patients are seen on a first come, first serve basis.    Medicaid-accepting [facility] Providers:  Organization         Address  Phone   Notes  [facility] [address],  [phone] Also accepts self-pay patients.  [facility] [address] Laurell Josephs [place], [place]  [phone]   [facility] [address], [place] [phone]   [facility] [address] [place] [phone]   Renaye Rakers [address], [place]   [phone] Only accepts [place] Access [place] patients after they have their name applied to their card.   Self-Pay (no insurance) in [facility]:  Organization         Address  Phone   Notes  Sickle Cell Patients, [facility] Internal Medicine [address] [place], [place] [phone]   [facility] [address] [place],  [place] [phone]   [facility] [place]  [address], [place] [phone]   [facility]/Dr. Osei-Bonsu  [address] [place] or [address], [place] [phone] Phone number for both [place] and [place] locations is the same.  [facility] [address] [place] [phone]   [facility] [address], [place] or [address] [phone]   [facility] [address] [place] [phone], phone; [phone], fax Sees patients 1st and 3rd Saturday of every month.  Must not qualify for public or private insurance (i.e. Medicaid, Medicare, [place] Health Choice, Veterans' Benefits)  Household income should be no more than 200% of the poverty level The clinic cannot treat you if you are pregnant or think you are pregnant  Sexually transmitted diseases are not treated at the clinic.  Dental Care: Organization         Address  Phone  Notes  [facility] Department of [facility] [address] [place], [place] [phone] Accepts children up to age 14 who are enrolled in [place] or [place] Health Choice; pregnant women with a Medicaid card; and children who have applied for Medicaid or [place] Health Choice, but were declined, whose parents can pay a reduced fee at time of service.  [facility] Department of [facility]  [address], [place] [phone] Accepts children up to age 18 who are enrolled in [place] or [place] Health Choice; pregnant women with a Medicaid card; and children who have applied for Medicaid or  Health Choice, but were declined, whose parents can pay a reduced fee at time of service.  [facility]  [address] [place], [place] [phone] Patients are seen by appointment only. Walk-ins are not accepted. [facility] will see patients 11 years of age and older. Monday - Tuesday (8am-5pm) Most Wednesdays (8:30-5pm) $30 per visit, cash only  [facility] Adult [facility] Access [facility]  [address], [facility] [phone] Patients are seen by appointment only. Walk-ins are not accepted. [facility] will see patients 71 years of age and older. One Wednesday Evening (Monthly: Volunteer Based).  $30 per visit, cash only  [employer]  [phone] for adults; Children under age 87, call Graduate Pediatric Dentistry at [phone]. Children aged 78-14, please call [phone] to request a pediatric application.  Dental services are provided in all areas of dental care including fillings, crowns and bridges, complete and partial dentures, implants, gum treatment, root canals, and extractions. Preventive care is also provided. Treatment is provided to both adults and children. Patients are selected via a lottery and there is often a waiting list.   [facility] [address] [place]  [phone] [email or web address]   [facility] [address] [place], [place] [phone], Ext. [phone] Second and Fourth Thursday of each month, opens at 6:30 AM; Clinic ends at 9 AM.  Patients are seen on a first-come first-served basis, and a limited number are seen during each clinic.   [facility]  [address] Ether Griffins [place], [place] [phone]   Eligibility Requirements You must have lived in [place], [place], or [place] counties for at least the last three months.   You cannot be eligible for state or federal sponsored [place], including [employer], [place], or [employer].   You generally cannot be eligible for healthcare insurance through your employer.    How to apply: Eligibility screenings are held every Tuesday and Wednesday afternoon from 1:00 pm until 4:00 pm. You do not need an appointment for the interview!   [facility] [address] [place], [place] [phone]   [facility]  [phone]   [facility] Health [facility]  [phone]   [facility] Health Department  [phone]    Behavioral Health Resources in the Community: Intensive Outpatient Programs Organization         Address  Phone  Notes  [facility] [address], [place], [place] [phone]   [facility] [address], [place], [place] [phone]   [facility] [address], [place], [place]  [phone]   [facility] [address],  [place], [place] [phone] or [phone]   Substance Abuse Resources Organization         Address  Phone  Notes  [facility]  (206) [phone]   [facility]  (737)[phone]   [facility]  [phone]   Floydene Flock  [phone]   Residential & Outpatient Substance Abuse Program  [phone]   Psychological Services Organization         Address  Phone  Notes  [facility] Behavior[facility] Health  [phone]   [facility] Services  [phone]   [facility] [address], [place] [phone] or [phone]    Mobile Crisis Teams Organization         Address  Phone  Notes  [facility], Mobile Crisis Care Unit  [phone]   [facility]  [address] [place], [place] [phone]   Doristine Locks [address] [place] [phone]    Self-Help/Support Groups Organization         Address  Phone             Notes  [facility] [place] - variety of support groups  336- I7437963 Call for more information  Narcotics Anonymous (NA), [facility] [address], [employer] [place]  2 meetings at this location   Statistician         Address  Phone  Notes  [facility] [address],    [place]  [phone]   [facility]  [address] [place] [address], [place], [place] [phone]   [facility] [address] [place], [place] [phone] Admissions: 8am-3pm M-F  [facility] [address],    [place], [place] [phone]   [facility] [address] [place], [place], [place] [phone]   [facility] [address],  [place], [place] [phone]   [facility] - Intensive Outpatient [address] Laurell Josephs [address], [place], [place] [phone]   [facility] ([facility] [address] [place].,  [place], [place] [phone] or [phone])[phone]   [facility] ([facility]) [address] [place], [place] [phone] Accepts Medicaid  [facility] [address],  [place] [phone] Substance Abuse/Addiction Treatment   [facility] Organization         Address  Phone  Notes  [facility]  ([phone]) [phone]   Angie Fava, PhD [address] Ervin Knack [place], [place]   ([phone] or [phone]   [facility] Behavioral   [address] [place], [place] [phone]   [facility] [address], [place], [place] [phone] Insurance/Medicaid/sponsorship through [facility] [address]                                    [place], [place] [phone] Therapy/tele-psych/case  [facility] [address], [place] [phone]    Dr. Lolly Mustache  [phone]   [facility]  [employer] [facility] Dept. 1) [address] [place] 2) [address] [place] 3)  [address], [place] [phone]  [phone]   [facility] Child Abuse Hotline [phone] or [phone] (After Hours)

## 2013-12-03 ENCOUNTER — Encounter (HOSPITAL_COMMUNITY): Payer: Self-pay | Admitting: Emergency Medicine
